# Patient Record
Sex: Female | Born: 1980 | Race: Black or African American | Hispanic: No | Marital: Single | Smoking: Never smoker
Health system: Southern US, Community
[De-identification: ages and names within clinical notes are randomized; demographics above are authoritative.]

## PROBLEM LIST (undated history)

## (undated) HISTORY — PX: OTHER SURGICAL HISTORY: SHX169

## (undated) HISTORY — PX: MOLE REMOVAL: SHX2046

---

## 2005-03-29 ENCOUNTER — Emergency Department: Payer: Self-pay | Admitting: General Practice

## 2005-04-24 ENCOUNTER — Emergency Department: Payer: Self-pay | Admitting: Internal Medicine

## 2005-07-25 ENCOUNTER — Observation Stay: Payer: Self-pay | Admitting: Obstetrics and Gynecology

## 2005-10-12 ENCOUNTER — Observation Stay: Payer: Self-pay | Admitting: Obstetrics and Gynecology

## 2006-01-18 ENCOUNTER — Emergency Department: Payer: Self-pay | Admitting: Emergency Medicine

## 2007-11-25 ENCOUNTER — Emergency Department: Payer: Self-pay | Admitting: Emergency Medicine

## 2010-01-24 ENCOUNTER — Emergency Department (HOSPITAL_COMMUNITY): Admission: EM | Admit: 2010-01-24 | Discharge: 2010-01-24 | Payer: Self-pay | Admitting: Emergency Medicine

## 2010-03-25 ENCOUNTER — Inpatient Hospital Stay (HOSPITAL_COMMUNITY): Admission: AD | Admit: 2010-03-25 | Discharge: 2010-03-25 | Payer: Self-pay | Admitting: Obstetrics & Gynecology

## 2011-02-11 LAB — HERPES SIMPLEX VIRUS CULTURE

## 2011-02-11 LAB — DIFFERENTIAL
Basophils Absolute: 0 10*3/uL (ref 0.0–0.1)
Basophils Relative: 0 % (ref 0–1)
Eosinophils Absolute: 0.1 10*3/uL (ref 0.0–0.7)
Lymphs Abs: 1 10*3/uL (ref 0.7–4.0)
Neutro Abs: 3.9 10*3/uL (ref 1.7–7.7)

## 2011-02-11 LAB — CBC
Hemoglobin: 10.1 g/dL — ABNORMAL LOW (ref 12.0–15.0)
MCHC: 35.2 g/dL (ref 30.0–36.0)
WBC: 5.4 10*3/uL (ref 4.0–10.5)

## 2011-02-11 LAB — WOUND CULTURE
Culture: NO GROWTH
Gram Stain: NONE SEEN

## 2011-02-17 LAB — DIFFERENTIAL
Basophils Relative: 0 % (ref 0–1)
Eosinophils Absolute: 0.1 10*3/uL (ref 0.0–0.7)
Eosinophils Relative: 1 % (ref 0–5)
Lymphocytes Relative: 28 % (ref 12–46)
Lymphs Abs: 2.2 10*3/uL (ref 0.7–4.0)
Monocytes Absolute: 0.5 10*3/uL (ref 0.1–1.0)
Neutro Abs: 5.2 10*3/uL (ref 1.7–7.7)

## 2011-02-17 LAB — URINE MICROSCOPIC-ADD ON

## 2011-02-17 LAB — CBC
HCT: 35.6 % — ABNORMAL LOW (ref 36.0–46.0)
MCHC: 33.8 g/dL (ref 30.0–36.0)
MCV: 83.1 fL (ref 78.0–100.0)
RDW: 13.5 % (ref 11.5–15.5)

## 2011-02-17 LAB — WET PREP, GENITAL
Trich, Wet Prep: NONE SEEN
Yeast Wet Prep HPF POC: NONE SEEN

## 2011-02-17 LAB — URINALYSIS, ROUTINE W REFLEX MICROSCOPIC
Hgb urine dipstick: NEGATIVE
Ketones, ur: NEGATIVE mg/dL

## 2011-02-17 LAB — PREGNANCY, URINE: Preg Test, Ur: POSITIVE

## 2011-02-17 LAB — GC/CHLAMYDIA PROBE AMP, GENITAL
Chlamydia, DNA Probe: NEGATIVE
GC Probe Amp, Genital: NEGATIVE

## 2011-05-11 ENCOUNTER — Emergency Department: Payer: Self-pay | Admitting: Emergency Medicine

## 2011-11-18 ENCOUNTER — Emergency Department: Payer: Self-pay | Admitting: *Deleted

## 2015-01-09 ENCOUNTER — Emergency Department: Payer: Self-pay | Admitting: Emergency Medicine

## 2015-04-22 ENCOUNTER — Encounter: Payer: Self-pay | Admitting: Emergency Medicine

## 2015-04-22 DIAGNOSIS — Z3202 Encounter for pregnancy test, result negative: Secondary | ICD-10-CM | POA: Diagnosis not present

## 2015-04-22 DIAGNOSIS — N8320 Unspecified ovarian cysts: Secondary | ICD-10-CM | POA: Insufficient documentation

## 2015-04-22 DIAGNOSIS — N760 Acute vaginitis: Secondary | ICD-10-CM | POA: Diagnosis not present

## 2015-04-22 DIAGNOSIS — R109 Unspecified abdominal pain: Secondary | ICD-10-CM | POA: Diagnosis present

## 2015-04-22 LAB — COMPREHENSIVE METABOLIC PANEL
ALBUMIN: 4 g/dL (ref 3.5–5.0)
ALT: 12 U/L — ABNORMAL LOW (ref 14–54)
AST: 16 U/L (ref 15–41)
Alkaline Phosphatase: 68 U/L (ref 38–126)
Anion gap: 10 (ref 5–15)
BUN: 11 mg/dL (ref 6–20)
CALCIUM: 9.3 mg/dL (ref 8.9–10.3)
CO2: 27 mmol/L (ref 22–32)
CREATININE: 0.86 mg/dL (ref 0.44–1.00)
Chloride: 102 mmol/L (ref 101–111)
GFR calc non Af Amer: >60 mL/min (ref >60)
Glucose, Bld: 99 mg/dL (ref 65–99)
Potassium: 4.2 mmol/L (ref 3.5–5.1)
Sodium: 139 mmol/L (ref 135–145)
Total Bilirubin: 0.2 mg/dL — ABNORMAL LOW (ref 0.3–1.2)
Total Protein: 8.3 g/dL — ABNORMAL HIGH (ref 6.5–8.1)

## 2015-04-22 LAB — CBC WITH DIFFERENTIAL/PLATELET
BASOS PCT: 1 %
Basophils Absolute: 0.1 10*3/uL (ref 0–0.1)
EOS ABS: 0.1 10*3/uL (ref 0–0.7)
EOS PCT: 2 %
HEMATOCRIT: 34.4 % — AB (ref 35.0–47.0)
Hemoglobin: 11.3 g/dL — ABNORMAL LOW (ref 12.0–16.0)
LYMPHS ABS: 2.3 10*3/uL (ref 1.0–3.6)
Lymphocytes Relative: 30 %
MCH: 25.8 pg — AB (ref 26.0–34.0)
MCHC: 33 g/dL (ref 32.0–36.0)
MCV: 78.3 fL — AB (ref 80.0–100.0)
MONO ABS: 0.4 10*3/uL (ref 0.2–0.9)
Monocytes Relative: 6 %
Neutro Abs: 4.9 10*3/uL (ref 1.4–6.5)
Neutrophils Relative %: 63 %
Platelets: 356 10*3/uL (ref 150–440)
RBC: 4.39 MIL/uL (ref 3.80–5.20)
RDW: 14.5 % (ref 11.5–14.5)
WBC: 7.9 10*3/uL (ref 3.6–11.0)

## 2015-04-22 LAB — URINALYSIS COMPLETE WITH MICROSCOPIC (ARMC ONLY)
Bilirubin Urine: NEGATIVE
GLUCOSE, UA: NEGATIVE mg/dL
KETONES UR: NEGATIVE mg/dL
Nitrite: NEGATIVE
PROTEIN: NEGATIVE mg/dL
SPECIFIC GRAVITY, URINE: 1.033 — AB (ref 1.005–1.030)
pH: 5 (ref 5.0–8.0)

## 2015-04-22 LAB — POCT PREGNANCY, URINE: PREG TEST UR: NEGATIVE

## 2015-04-22 NOTE — ED Notes (Signed)
Pt states has had llq pain radiating to pelvis since yesterday am. Pt denies fever, chills, diarrhea, vomiting, nausea. Pt states "i have felt a little lightheaded." pt denies dysuria, hematuria. Pt describes pain as intermittent cramp like "spasms". Pt with normal color, warm and dry skin.

## 2015-04-23 ENCOUNTER — Emergency Department
Admission: EM | Admit: 2015-04-23 | Discharge: 2015-04-23 | Disposition: A | Discharge status: Home / Self Care | Payer: Medicaid Other | Attending: Emergency Medicine | Admitting: Emergency Medicine

## 2015-04-23 ENCOUNTER — Emergency Department: Payer: Medicaid Other

## 2015-04-23 DIAGNOSIS — R102 Pelvic and perineal pain: Secondary | ICD-10-CM

## 2015-04-23 DIAGNOSIS — R1032 Left lower quadrant pain: Secondary | ICD-10-CM

## 2015-04-23 DIAGNOSIS — N83202 Unspecified ovarian cyst, left side: Secondary | ICD-10-CM

## 2015-04-23 DIAGNOSIS — N760 Acute vaginitis: Secondary | ICD-10-CM

## 2015-04-23 DIAGNOSIS — B9689 Other specified bacterial agents as the cause of diseases classified elsewhere: Secondary | ICD-10-CM

## 2015-04-23 DIAGNOSIS — R52 Pain, unspecified: Secondary | ICD-10-CM

## 2015-04-23 LAB — CHLAMYDIA/NGC RT PCR (ARMC ONLY)
CHLAMYDIA TR: NOT DETECTED
N GONORRHOEAE: NOT DETECTED

## 2015-04-23 LAB — WET PREP, GENITAL
Trich, Wet Prep: NONE SEEN
YEAST WET PREP: NONE SEEN

## 2015-04-23 MED ORDER — ONDANSETRON 4 MG PO TBDP
4.0000 mg | ORAL_TABLET | Freq: Once | ORAL | Status: AC
  Administered 2015-04-23: 4 mg via ORAL

## 2015-04-23 MED ORDER — ONDANSETRON 4 MG PO TBDP
ORAL_TABLET | ORAL | Status: AC
  Administered 2015-04-23: 4 mg via ORAL
  Filled 2015-04-23: qty 1

## 2015-04-23 MED ORDER — MORPHINE SULFATE 4 MG/ML IJ SOLN
4.0000 mg | Freq: Once | INTRAMUSCULAR | Status: AC
  Administered 2015-04-23: 4 mg via INTRAMUSCULAR

## 2015-04-23 MED ORDER — ONDANSETRON HCL 4 MG PO TABS: 4.0000 mg | ORAL_TABLET | Freq: Three times a day (TID) | ORAL | Status: DC | PRN

## 2015-04-23 MED ORDER — METRONIDAZOLE 500 MG PO TABS: 500.0000 mg | ORAL_TABLET | Freq: Two times a day (BID) | ORAL | Status: DC

## 2015-04-23 MED ORDER — OXYCODONE-ACETAMINOPHEN 5-325 MG PO TABS
ORAL_TABLET | ORAL | Status: AC
  Administered 2015-04-23: 1 via ORAL
  Filled 2015-04-23: qty 1

## 2015-04-23 MED ORDER — OXYCODONE-ACETAMINOPHEN 5-325 MG PO TABS
1.0000 | ORAL_TABLET | Freq: Once | ORAL | Status: AC
  Administered 2015-04-23: 1 via ORAL

## 2015-04-23 MED ORDER — MORPHINE SULFATE 4 MG/ML IJ SOLN
INTRAMUSCULAR | Status: AC
  Administered 2015-04-23: 4 mg via INTRAMUSCULAR
  Filled 2015-04-23: qty 1

## 2015-04-23 MED ORDER — OXYCODONE-ACETAMINOPHEN 5-325 MG PO TABS: 1.0000 | ORAL_TABLET | ORAL | Status: DC | PRN

## 2015-04-23 NOTE — Discharge Instructions (Signed)
1. Take antibiotic as prescribed (Flagyl 500 mg twice daily 7 days). 2. Take pain medicines as needed (Motrin/Percocet #15). 3. Take nausea medicine as needed (Zofran #15). 4. Return to the ER for worsening symptoms, persistent vomiting, difficulty breathing or other concerns.  Pelvic Pain Female pelvic pain can be caused by many different things and start from a variety of places. Pelvic pain refers to pain that is located in the lower half of the abdomen and between your hips. The pain may occur over a short period of time (acute) or may be reoccurring (chronic). The cause of pelvic pain may be related to disorders affecting the female reproductive organs (gynecologic), but it may also be related to the bladder, kidney stones, an intestinal complication, or muscle or skeletal problems. Getting help right away for pelvic pain is important, especially if there has been severe, sharp, or a sudden onset of unusual pain. It is also important to get help right away because some types of pelvic pain can be life threatening.  CAUSES  Below are only some of the causes of pelvic pain. The causes of pelvic pain can be in one of several categories.   Gynecologic.  Pelvic inflammatory disease.  Sexually transmitted infection.  Ovarian cyst or a twisted ovarian ligament (ovarian torsion).  Uterine lining that grows outside the uterus (endometriosis).  Fibroids, cysts, or tumors.  Ovulation.  Pregnancy.  Pregnancy that occurs outside the uterus (ectopic pregnancy).  Miscarriage.  Labor.  Abruption of the placenta or ruptured uterus.  Infection.  Uterine infection (endometritis).  Bladder infection.  Diverticulitis.  Miscarriage related to a uterine infection (septic abortion).  Bladder.  Inflammation of the bladder (cystitis).  Kidney stone(s).  Gastrointestinal.  Constipation.  Diverticulitis.  Neurologic.  Trauma.  Feeling pelvic pain because of mental or emotional  causes (psychosomatic).  Cancers of the bowel or pelvis. EVALUATION  Your caregiver will want to take a careful history of your concerns. This includes recent changes in your health, a careful gynecologic history of your periods (menses), and a sexual history. Obtaining your family history and medical history is also important. Your caregiver may suggest a pelvic exam. A pelvic exam will help identify the location and severity of the pain. It also helps in the evaluation of which organ system may be involved. In order to identify the cause of the pelvic pain and be properly treated, your caregiver may order tests. These tests may include:   A pregnancy test.  Pelvic ultrasonography.  An X-ray exam of the abdomen.  A urinalysis or evaluation of vaginal discharge.  Blood tests. HOME CARE INSTRUCTIONS   Only take over-the-counter or prescription medicines for pain, discomfort, or fever as directed by your caregiver.   Rest as directed by your caregiver.   Eat a balanced diet.   Drink enough fluids to make your urine clear or pale yellow, or as directed.   Avoid sexual intercourse if it causes pain.   Apply warm or cold compresses to the lower abdomen depending on which one helps the pain.   Avoid stressful situations.   Keep a journal of your pelvic pain. Write down when it started, where the pain is located, and if there are things that seem to be associated with the pain, such as food or your menstrual cycle.  Follow up with your caregiver as directed.  SEEK MEDICAL CARE IF:  Your medicine does not help your pain.  You have abnormal vaginal discharge. SEEK IMMEDIATE MEDICAL CARE IF:  You have heavy bleeding from the vagina.   Your pelvic pain increases.   You feel light-headed or faint.   You have chills.   You have pain with urination or blood in your urine.   You have uncontrolled diarrhea or vomiting.   You have a fever or persistent symptoms for  more than 3 days.  You have a fever and your symptoms suddenly get worse.   You are being physically or sexually abused.  MAKE SURE YOU:  Understand these instructions.  Will watch your condition.  Will get help if you are not doing well or get worse. Document Released: 10/07/2004 Document Revised: 03/27/2014 Document Reviewed: 03/01/2012 [facility] Patient Information 2015 [place], [place]. This information is not intended to replace advice given to you by your health care provider. Make sure you discuss any questions you have with your health care provider.  Ovarian Cyst An ovarian cyst is a fluid-filled sac that forms on an ovary. The ovaries are small organs that produce eggs in women. Various types of cysts can form on the ovaries. Most are not cancerous. Many do not cause problems, and they often go away on their own. Some may cause symptoms and require treatment. Common types of ovarian cysts include:  Functional cysts--These cysts may occur every month during the menstrual cycle. This is normal. The cysts usually go away with the next menstrual cycle if the woman does not get pregnant. Usually, there are no symptoms with a functional cyst.  Endometrioma cysts--These cysts form from the tissue that lines the uterus. They are also called "chocolate cysts" because they become filled with blood that turns brown. This type of cyst can cause pain in the lower abdomen during intercourse and with your menstrual period.  Cystadenoma cysts--This type develops from the cells on the outside of the ovary. These cysts can get very big and cause lower abdomen pain and pain with intercourse. This type of cyst can twist on itself, cut off its blood supply, and cause severe pain. It can also easily rupture and cause a lot of pain.  Dermoid cysts--This type of cyst is sometimes found in both ovaries. These cysts may contain different kinds of body tissue, such as skin, teeth, hair, or cartilage. They  usually do not cause symptoms unless they get very big.  Theca lutein cysts--These cysts occur when too much of a certain hormone (human chorionic gonadotropin) is produced and overstimulates the ovaries to produce an egg. This is most common after procedures used to assist with the conception of a baby (in vitro fertilization). CAUSES   Fertility drugs can cause a condition in which multiple large cysts are formed on the ovaries. This is called ovarian hyperstimulation syndrome.  A condition called polycystic ovary syndrome can cause hormonal imbalances that can lead to nonfunctional ovarian cysts. SIGNS AND SYMPTOMS  Many ovarian cysts do not cause symptoms. If symptoms are present, they may include:  Pelvic pain or pressure.  Pain in the lower abdomen.  Pain during sexual intercourse.  Increasing girth (swelling) of the abdomen.  Abnormal menstrual periods.  Increasing pain with menstrual periods.  Stopping having menstrual periods without being pregnant. DIAGNOSIS  These cysts are commonly found during a routine or annual pelvic exam. Tests may be ordered to find out more about the cyst. These tests may include:  Ultrasound.  X-ray of the pelvis.  CT scan.  MRI.  Blood tests. TREATMENT  Many ovarian cysts go away on their own without treatment. Your  health care provider may want to check your cyst regularly for 2-3 months to see if it changes. For women in menopause, it is particularly important to monitor a cyst closely because of the higher rate of ovarian cancer in menopausal women. When treatment is needed, it may include any of the following:  A procedure to drain the cyst (aspiration). This may be done using a long needle and ultrasound. It can also be done through a laparoscopic procedure. This involves using a thin, lighted tube with a tiny camera on the end (laparoscope) inserted through a small incision.  Surgery to remove the whole cyst. This may be done using  laparoscopic surgery or an open surgery involving a larger incision in the lower abdomen.  Hormone treatment or birth control pills. These methods are sometimes used to help dissolve a cyst. HOME CARE INSTRUCTIONS   Only take over-the-counter or prescription medicines as directed by your health care provider.  Follow up with your health care provider as directed.  Get regular pelvic exams and Pap tests. SEEK MEDICAL CARE IF:   Your periods are late, irregular, or painful, or they stop.  Your pelvic pain or abdominal pain does not go away.  Your abdomen becomes larger or swollen.  You have pressure on your bladder or trouble emptying your bladder completely.  You have pain during sexual intercourse.  You have feelings of fullness, pressure, or discomfort in your stomach.  You lose weight for no apparent reason.  You feel generally ill.  You become constipated.  You lose your appetite.  You develop acne.  You have an increase in body and facial hair.  You are gaining weight, without changing your exercise and eating habits.  You think you are pregnant. SEEK IMMEDIATE MEDICAL CARE IF:   You have increasing abdominal pain.  You feel sick to your stomach (nauseous), and you throw up (vomit).  You develop a fever that comes on suddenly.  You have abdominal pain during a bowel movement.  Your menstrual periods become heavier than usual. MAKE SURE YOU:  Understand these instructions.  Will watch your condition.  Will get help right away if you are not doing well or get worse. Document Released: 11/10/2005 Document Revised: 11/15/2013 Document Reviewed: 07/18/2013 [facility] Patient Information 2015 [place], [place]. This information is not intended to replace advice given to you by your health care provider. Make sure you discuss any questions you have with your health care provider.  Bacterial Vaginosis Bacterial vaginosis is a vaginal infection that occurs when the  normal balance of bacteria in the vagina is disrupted. It results from an overgrowth of certain bacteria. This is the most common vaginal infection in women of childbearing age. Treatment is important to prevent complications, especially in pregnant women, as it can cause a premature delivery. CAUSES  Bacterial vaginosis is caused by an increase in harmful bacteria that are normally present in smaller amounts in the vagina. Several different kinds of bacteria can cause bacterial vaginosis. However, the reason that the condition develops is not fully understood. RISK FACTORS Certain activities or behaviors can put you at an increased risk of developing bacterial vaginosis, including:  Having a new sex partner or multiple sex partners.  Douching.  Using an intrauterine device (IUD) for contraception. Women do not get bacterial vaginosis from toilet seats, bedding, swimming pools, or contact with objects around them. SIGNS AND SYMPTOMS  Some women with bacterial vaginosis have no signs or symptoms. Common symptoms include:  Grey vaginal  discharge.  A fishlike odor with discharge, especially after sexual intercourse.  Itching or burning of the vagina and vulva.  Burning or pain with urination. DIAGNOSIS  Your health care provider will take a medical history and examine the vagina for signs of bacterial vaginosis. A sample of vaginal fluid may be taken. Your health care provider will look at this sample under a microscope to check for bacteria and abnormal cells. A vaginal pH test may also be done.  TREATMENT  Bacterial vaginosis may be treated with antibiotic medicines. These may be given in the form of a pill or a vaginal cream. A second round of antibiotics may be prescribed if the condition comes back after treatment.  HOME CARE INSTRUCTIONS   Only take over-the-counter or prescription medicines as directed by your health care provider.  If antibiotic medicine was prescribed, take it as  directed. Make sure you finish it even if you start to feel better.  Do not have sex until treatment is completed.  Tell all sexual partners that you have a vaginal infection. They should see their health care provider and be treated if they have problems, such as a mild rash or itching.  Practice safe sex by using condoms and only having one sex partner. SEEK MEDICAL CARE IF:   Your symptoms are not improving after 3 days of treatment.  You have increased discharge or pain.  You have a fever. MAKE SURE YOU:   Understand these instructions.  Will watch your condition.  Will get help right away if you are not doing well or get worse. FOR MORE INFORMATION  Centers for Disease Control and Prevention, Division of STD Prevention: [employer] American Sexual Health Association (ASHA): www.ashastd.org  Document Released: 11/10/2005 Document Revised: 08/31/2013 Document Reviewed: 06/22/2013 [facility] Patient Information 2015 [place], [place]. This information is not intended to replace advice given to you by your health care provider. Make sure you discuss any questions you have with your health care provider.

## 2015-04-23 NOTE — ED Notes (Signed)
Patient transported to Ultrasound 

## 2015-04-23 NOTE — ED Provider Notes (Signed)
[facility] Emergency Department Provider Note  ____________________________________________  Time seen: Approximately 12:46 AM  I have reviewed the triage vital signs and the nursing notes.   HISTORY  Chief Complaint Abdominal Pain    HPI Taylor Stephens is a 34 y.o. female who presents with a 2 day history of left lower quadrant pelvic pain. States she awoke with pain yesterday and has had waxing/waning pain since. Patient describes 7/10 sharp left pelvic pain radiating to suprapubic region. States she has been "a little lightheaded". Patient denies fever, chills, chest pain, shortness of breath, nausea, vomiting, diarrhea, headache, weakness, numbness, tingling. Nothing makes the pain better or worse. Patient has never had pain like this previously.Patient denies vaginal discharge or bleeding; last sexual intercourse over 1 week ago. Patient denies concern for STDs.   History reviewed. No pertinent past medical history. No history of ovarian cysts or endometriosis.  There are no active problems to display for this patient.   Past Surgical History  Procedure Laterality Date  . Tubal ligastion     Medications None  Allergies Ibuprofen  Past Family History Ovarian cysts  Social History History  Substance Use Topics  . Smoking status: Never Smoker   . Smokeless tobacco: Never Used  . Alcohol Use: Yes    Review of Systems Constitutional: No fever/chills Eyes: No visual changes. ENT: No sore throat. Cardiovascular: Denies chest pain. Respiratory: Denies shortness of breath. Gastrointestinal: Positive for abdominal pain.  No nausea, no vomiting.  No diarrhea.  No constipation. Genitourinary: Negative for dysuria. Musculoskeletal: Negative for back pain. Skin: Negative for rash. Neurological: Negative for headaches, focal weakness or numbness.  10-point ROS otherwise negative.  ____________________________________________   PHYSICAL  EXAM:  VITAL SIGNS: ED Triage Vitals  Enc Vitals Group     BP 04/22/15 2035 137/76 mmHg     Pulse Rate 04/22/15 2035 85     Resp 04/22/15 2035 16     Temp 04/22/15 2035 98.2 F (36.8 C)     Temp Source 04/22/15 2035 Oral     SpO2 04/22/15 2035 100 %     Weight 04/22/15 2035 180 lb (81.647 kg)     Height 04/22/15 2035 5\' 3"  (1.6 m)     Head Cir --      Peak Flow --      Pain Score 04/22/15 2036 8     Pain Loc --      Pain Edu? --      Excl. in GC? --     Constitutional: Alert and oriented. Well appearing and in mild acute distress. Eyes: Conjunctivae are normal. PERRL. EOMI. Head: Atraumatic. Nose: No congestion/rhinnorhea. Mouth/Throat: Mucous membranes are moist.  Oropharynx non-erythematous. Neck: No stridor.   Cardiovascular: Normal rate, regular rhythm. Grossly normal heart sounds.  Good peripheral circulation. Respiratory: Normal respiratory effort.  No retractions. Lungs CTAB. Gastrointestinal: Soft, tender to palpation left lower quadrant without rebound or guarding.. No distention. No abdominal bruits. No CVA tenderness. Pelvic: External exam normal without vesicles. Speculum exam with mild white discharge. No vaginal bleeding. No cervical dilation. Left adnexa tender on bimanual exam. Musculoskeletal: No lower extremity tenderness nor edema.  No joint effusions. Neurologic:  Normal speech and language. No gross focal neurologic deficits are appreciated. Speech is normal. No gait instability. Skin:  Skin is warm, dry and intact. No rash noted. Psychiatric: Mood and affect are normal. Speech and behavior are normal.  ____________________________________________   LABS (all labs ordered are listed, but only  abnormal results are displayed)  Labs Reviewed  WET PREP, GENITAL - Abnormal; Notable for the following:    Clue Cells Wet Prep HPF POC MODERATE (*)    WBC, Wet Prep HPF POC MANY (*)    All other components within normal limits  CBC WITH DIFFERENTIAL/PLATELET -  Abnormal; Notable for the following:    Hemoglobin 11.3 (*)    HCT 34.4 (*)    MCV 78.3 (*)    MCH 25.8 (*)    All other components within normal limits  COMPREHENSIVE METABOLIC PANEL - Abnormal; Notable for the following:    Total Protein 8.3 (*)    ALT 12 (*)    Total Bilirubin 0.2 (*)    All other components within normal limits  URINALYSIS COMPLETEWITH MICROSCOPIC ([facility] ONLY) - Abnormal; Notable for the following:    Color, Urine YELLOW (*)    APPearance HAZY (*)    Specific Gravity, Urine 1.033 (*)    Hgb urine dipstick 1+ (*)    Leukocytes, UA 1+ (*)    Bacteria, UA RARE (*)    Squamous Epithelial / LPF 6-30 (*)    All other components within normal limits  CHLAMYDIA/NGC RT PCR ([facility] ONLY)  POC URINE PREG, ED  POCT PREGNANCY, URINE   ____________________________________________  EKG  None ____________________________________________  RADIOLOGY  Pelvis ultrasound per Dr. Cherly Hensen: 1. 3.5 cm hemorrhagic cyst at the left ovary. 2. Otherwise unremarkable pelvic ultrasound. No evidence for ovarian torsion. __________________________________________   PROCEDURES  Procedure(s) performed: None  Critical Care performed: No  ____________________________________________   INITIAL IMPRESSION / ASSESSMENT AND PLAN / ED COURSE  Pertinent labs & imaging results that were available during my care of the patient were reviewed by me and considered in my medical decision making (see chart for details).  34 year old female presenting with left adnexal pain. Will obtain wet prep and GC/Chlamydia specimens. Plan for IM analgesia and pelvic ultrasound.  ----------------------------------------- 4:01 AM on 04/23/2015 -----------------------------------------  Patient improved. Discussed with patient results of wet prep and ultrasound. Plan for analgesia, Flagyl and gynecological follow-up. Strict return precautions given. Patient verbalizes understanding and agrees  with plan of care. ____________________________________________   FINAL CLINICAL IMPRESSION(S) / ED DIAGNOSES  Final diagnoses:  Pain  Pelvic pain in female  Cyst of left ovary  Bacterial vaginosis      Irean Hong, MD 04/23/15 [phone]

## 2015-07-11 ENCOUNTER — Emergency Department
Admission: EM | Admit: 2015-07-11 | Discharge: 2015-07-11 | Disposition: A | Discharge status: Home / Self Care | Payer: Medicaid Other | Attending: Emergency Medicine | Admitting: Emergency Medicine

## 2015-07-11 DIAGNOSIS — J069 Acute upper respiratory infection, unspecified: Secondary | ICD-10-CM | POA: Diagnosis not present

## 2015-07-11 DIAGNOSIS — J029 Acute pharyngitis, unspecified: Secondary | ICD-10-CM | POA: Diagnosis present

## 2015-07-11 DIAGNOSIS — Z79899 Other long term (current) drug therapy: Secondary | ICD-10-CM | POA: Insufficient documentation

## 2015-07-11 LAB — POCT RAPID STREP A: Streptococcus, Group A Screen (Direct): NEGATIVE

## 2015-07-11 MED ORDER — ACETAMINOPHEN 325 MG PO TABS: 650.0000 mg | ORAL_TABLET | Freq: Once | ORAL | Status: AC

## 2015-07-11 MED ORDER — GUAIFENESIN-CODEINE 100-10 MG/5ML PO SOLN: 10.0000 mL | ORAL | Status: DC | PRN

## 2015-07-11 MED ORDER — CHLORPHENIRAMINE MALEATE 4 MG PO TABS: 4.0000 mg | ORAL_TABLET | Freq: Two times a day (BID) | ORAL | Status: DC | PRN

## 2015-07-11 MED ORDER — AZITHROMYCIN 250 MG PO TABS: ORAL_TABLET | ORAL | Status: DC

## 2015-07-11 MED ORDER — ACETAMINOPHEN 325 MG PO TABS
ORAL_TABLET | ORAL | Status: AC
  Administered 2015-07-11: 650 mg
  Filled 2015-07-11: qty 2

## 2015-07-11 NOTE — ED Notes (Signed)
Pt in with sore throat for 3 days, no has bodyaches, cough, and nasal congestion.

## 2015-07-11 NOTE — ED Notes (Signed)
Patient presents with a nonproductive cough, body aches, nausea without vomiting. States she has been feeling poorly x 3 days. Unknown if she has had a fever at home. States she does not feel like she has had one.

## 2015-07-11 NOTE — ED Notes (Signed)
Pt has a sore throat and cough for 4 days.  Nonsmoker.  No earache.

## 2015-07-11 NOTE — Discharge Instructions (Signed)
Upper Respiratory Infection, Adult An upper respiratory infection (URI) is also sometimes known as the common cold. The upper respiratory tract includes the nose, sinuses, throat, trachea, and bronchi. Bronchi are the airways leading to the lungs. Most people improve within 1 week, but symptoms can last up to 2 weeks. A residual cough may last even longer.  CAUSES Many different viruses can infect the tissues lining the upper respiratory tract. The tissues become irritated and inflamed and often become very moist. Mucus production is also common. A cold is contagious. You can easily spread the virus to others by oral contact. This includes kissing, sharing a glass, coughing, or sneezing. Touching your mouth or nose and then touching a surface, which is then touched by another person, can also spread the virus. SYMPTOMS  Symptoms typically develop 1 to 3 days after you come in contact with a cold virus. Symptoms vary from person to person. They may include:  Runny nose.  Sneezing.  Nasal congestion.  Sinus irritation.  Sore throat.  Loss of voice (laryngitis).  Cough.  Fatigue.  Muscle aches.  Loss of appetite.  Headache.  Low-grade fever. DIAGNOSIS  You might diagnose your own cold based on familiar symptoms, since most people get a cold 2 to 3 times a year. Your caregiver can confirm this based on your exam. Most importantly, your caregiver can check that your symptoms are not due to another disease such as strep throat, sinusitis, pneumonia, asthma, or epiglottitis. Blood tests, throat tests, and X-rays are not necessary to diagnose a common cold, but they may sometimes be helpful in excluding other more serious diseases. Your caregiver will decide if any further tests are required. RISKS AND COMPLICATIONS  You may be at risk for a more severe case of the common cold if you smoke cigarettes, have chronic heart disease (such as heart failure) or lung disease (such as asthma), or if  you have a weakened immune system. The very young and very old are also at risk for more serious infections. Bacterial sinusitis, middle ear infections, and bacterial pneumonia can complicate the common cold. The common cold can worsen asthma and chronic obstructive pulmonary disease (COPD). Sometimes, these complications can require emergency medical care and may be life-threatening. PREVENTION  The best way to protect against getting a cold is to practice good hygiene. Avoid oral or hand contact with people with cold symptoms. Wash your hands often if contact occurs. There is no clear evidence that vitamin C, vitamin E, echinacea, or exercise reduces the chance of developing a cold. However, it is always recommended to get plenty of rest and practice good nutrition. TREATMENT  Treatment is directed at relieving symptoms. There is no cure. Antibiotics are not effective, because the infection is caused by a virus, not by bacteria. Treatment may include:  Increased fluid intake. Sports drinks offer valuable electrolytes, sugars, and fluids.  Breathing heated mist or steam (vaporizer or shower).  Eating chicken soup or other clear broths, and maintaining good nutrition.  Getting plenty of rest.  Using gargles or lozenges for comfort.  Controlling fevers with ibuprofen or acetaminophen as directed by your caregiver.  Increasing usage of your inhaler if you have asthma. Zinc gel and zinc lozenges, taken in the first 24 hours of the common cold, can shorten the duration and lessen the severity of symptoms. Pain medicines may help with fever, muscle aches, and throat pain. A variety of non-prescription medicines are available to treat congestion and runny nose. Your caregiver   can make recommendations and may suggest nasal or lung inhalers for other symptoms.  HOME CARE INSTRUCTIONS   Only take over-the-counter or prescription medicines for pain, discomfort, or fever as directed by your  caregiver.  Use a warm mist humidifier or inhale steam from a shower to increase air moisture. This may keep secretions moist and make it easier to breathe.  Drink enough water and fluids to keep your urine clear or pale yellow.  Rest as needed.  Return to work when your temperature has returned to normal or as your caregiver advises. You may need to stay home longer to avoid infecting others. You can also use a face mask and careful hand washing to prevent spread of the virus. SEEK MEDICAL CARE IF:   After the first few days, you feel you are getting worse rather than better.  You need your caregiver's advice about medicines to control symptoms.  You develop chills, worsening shortness of breath, or brown or red sputum. These may be signs of pneumonia.  You develop yellow or brown nasal discharge or pain in the face, especially when you bend forward. These may be signs of sinusitis.  You develop a fever, swollen neck glands, pain with swallowing, or white areas in the back of your throat. These may be signs of strep throat. SEEK IMMEDIATE MEDICAL CARE IF:   You have a fever.  You develop severe or persistent headache, ear pain, sinus pain, or chest pain.  You develop wheezing, a prolonged cough, cough up blood, or have a change in your usual mucus (if you have chronic lung disease).  You develop sore muscles or a stiff neck. Document Released: 05/06/2001 Document Revised: 02/02/2012 Document Reviewed: 02/15/2014 Exit[employer] Patient Information 2015 [employer]. This information is not intended to replace advice given to you by your health care provider. Make sure you discuss any questions you have with your health care provider.  

## 2015-07-11 NOTE — ED Provider Notes (Signed)
[facility] Emergency Department Provider Note  ____________________________________________  Time seen: Approximately 8:31 PM  I have reviewed the triage vital signs and the nursing notes.   HISTORY  Chief Complaint Sore Throat    HPI Taylor RODNEISHA Stephens is a 34 y.o. female presents for evaluation of sore throat cough runny nose congestion drainage 4 days.   No past medical history on file.  There are no active problems to display for this patient.   Past Surgical History  Procedure Laterality Date  . Tubal ligastion      Current Outpatient Rx  Name  Route  Sig  Dispense  Refill  . azithromycin (ZITHROMAX Z-PAK) 250 MG tablet      Take 2 tablets (500 mg) on  Day 1,  followed by 1 tablet (250 mg) once daily on Days 2 through 5.   6 each   0   . chlorpheniramine (CHLOR-TRIMETON) 4 MG tablet   Oral   Take 1 tablet (4 mg total) by mouth 2 (two) times daily as needed for allergies or rhinitis.   30 tablet   0   . guaiFENesin-codeine 100-10 MG/5ML syrup   Oral   Take 10 mLs by mouth every 4 (four) hours as needed for cough.   180 mL   0   . metroNIDAZOLE (FLAGYL) 500 MG tablet   Oral   Take 1 tablet (500 mg total) by mouth 2 (two) times daily.   14 tablet   0   . ondansetron (ZOFRAN) 4 MG tablet   Oral   Take 1 tablet (4 mg total) by mouth every 8 (eight) hours as needed for nausea or vomiting.   20 tablet   1   . oxyCODONE-acetaminophen (ROXICET) 5-325 MG per tablet   Oral   Take 1 tablet by mouth every 4 (four) hours as needed for severe pain.   20 tablet   0     Allergies Ibuprofen  No family history on file.  Social History Social History  Substance Use Topics  . Smoking status: Never Smoker   . Smokeless tobacco: Never Used  . Alcohol Use: Yes    Review of Systems Constitutional: No fever/chills Eyes: No visual changes. ENT: Positive sore throat or nasal drainage head congestion Cardiovascular: Denies chest  pain. Respiratory: Denies shortness of breath. Positive for cough Gastrointestinal: No abdominal pain.  No nausea, no vomiting.  No diarrhea.  No constipation. Genitourinary: Negative for dysuria. Musculoskeletal: Negative for back pain. Skin: Negative for rash. Neurological: Negative for headaches, focal weakness or numbness.  10-point ROS otherwise negative.  ____________________________________________   PHYSICAL EXAM:  VITAL SIGNS: ED Triage Vitals  Enc Vitals Group     BP 07/11/15 1927 118/79 mmHg     Pulse Rate 07/11/15 1927 117     Resp 07/11/15 1927 18     Temp 07/11/15 1927 99.8 F (37.7 C)     Temp Source 07/11/15 1927 Oral     SpO2 07/11/15 1927 97 %     Weight 07/11/15 1927 190 lb (86.183 kg)     Height 07/11/15 1927  (1.6 m)     Head Cir --      Peak Flow --      Pain Score 07/11/15 1927 8     Pain Loc --      Pain Edu? --      Excl. in GC? --     Constitutional: Alert and oriented. Well appearing and in no acute  distress. Eyes: Conjunctivae are normal. PERRL. EOMI. Head: Atraumatic. Nose: No congestion/rhinnorhea. Mouth/Throat: Mucous membranes are moist.  Oropharynx non-erythematous. Neck: No stridor.  Cardiovascular: Normal rate, regular rhythm. Grossly normal heart sounds.  Good peripheral circulation. Respiratory: Normal respiratory effort.  No retractions. Lungs CTAB. Gastrointestinal: Soft and nontender. No distention. No abdominal bruits. No CVA tenderness. Musculoskeletal: No lower extremity tenderness nor edema.  No joint effusions. Neurologic:  Normal speech and language. No gross focal neurologic deficits are appreciated. No gait instability. Skin:  Skin is warm, dry and intact. No rash noted. Psychiatric: Mood and affect are normal. Speech and behavior are normal.  ____________________________________________   LABS (all labs ordered are listed, but only abnormal results are displayed)  Labs Reviewed  POCT RAPID STREP A    ____________________________________________  PROCEDURES  Procedure(s) performed: None  Critical Care performed: No  ____________________________________________   INITIAL IMPRESSION / ASSESSMENT AND PLAN / ED COURSE  Pertinent labs & imaging results that were available during my care of the patient were reviewed by me and considered in my medical decision making (see chart for details).  Acute pharyngitis with upper respiratory infection. Rx given for Zithromax, Robitussin-AC and chlorpheniramine for drainage. Work excuse 2 days. Patient follow-up with PCP or return to the ER with any worsening symptomology.  At the time of discharge patient voices no other emergency medical complaints. ____________________________________________   FINAL CLINICAL IMPRESSION(S) / ED DIAGNOSES  Final diagnoses:  URI, acute      Evangeline Dakin, PA-C 07/11/15 2232  Phineas Semen, MD 07/12/15 [phone]

## 2015-10-26 ENCOUNTER — Emergency Department: Payer: Medicaid Other

## 2015-10-26 ENCOUNTER — Encounter: Payer: Self-pay | Admitting: Emergency Medicine

## 2015-10-26 ENCOUNTER — Emergency Department
Admission: EM | Admit: 2015-10-26 | Discharge: 2015-10-26 | Disposition: A | Discharge status: Home / Self Care | Payer: Medicaid Other | Attending: Emergency Medicine | Admitting: Emergency Medicine

## 2015-10-26 DIAGNOSIS — R109 Unspecified abdominal pain: Secondary | ICD-10-CM

## 2015-10-26 DIAGNOSIS — R102 Pelvic and perineal pain: Secondary | ICD-10-CM

## 2015-10-26 DIAGNOSIS — N739 Female pelvic inflammatory disease, unspecified: Secondary | ICD-10-CM | POA: Diagnosis not present

## 2015-10-26 DIAGNOSIS — Z3202 Encounter for pregnancy test, result negative: Secondary | ICD-10-CM | POA: Insufficient documentation

## 2015-10-26 LAB — CHLAMYDIA/NGC RT PCR (ARMC ONLY)
Chlamydia Tr: NOT DETECTED
N gonorrhoeae: NOT DETECTED

## 2015-10-26 LAB — URINALYSIS COMPLETE WITH MICROSCOPIC (ARMC ONLY)
BILIRUBIN URINE: NEGATIVE
Bacteria, UA: NONE SEEN
Glucose, UA: NEGATIVE mg/dL
Ketones, ur: NEGATIVE mg/dL
Leukocytes, UA: NEGATIVE
Nitrite: NEGATIVE
Protein, ur: NEGATIVE mg/dL
Specific Gravity, Urine: 1.021 (ref 1.005–1.030)
pH: 7 (ref 5.0–8.0)

## 2015-10-26 LAB — CBC WITH DIFFERENTIAL/PLATELET
Basophils Absolute: 0 10*3/uL (ref 0–0.1)
Basophils Relative: 1 %
Eosinophils Absolute: 0.1 10*3/uL (ref 0–0.7)
Eosinophils Relative: 2 %
HCT: 36.6 % (ref 35.0–47.0)
Hemoglobin: 11.8 g/dL — ABNORMAL LOW (ref 12.0–16.0)
LYMPHS ABS: 1.8 10*3/uL (ref 1.0–3.6)
Lymphocytes Relative: 31 %
MCH: 24.8 pg — AB (ref 26.0–34.0)
MCHC: 32.2 g/dL (ref 32.0–36.0)
MCV: 76.9 fL — AB (ref 80.0–100.0)
MONO ABS: 0.2 10*3/uL (ref 0.2–0.9)
MONOS PCT: 4 %
Neutro Abs: 3.6 10*3/uL (ref 1.4–6.5)
Neutrophils Relative %: 62 %
PLATELETS: 295 10*3/uL (ref 150–440)
RBC: 4.76 MIL/uL (ref 3.80–5.20)
RDW: 14.8 % — AB (ref 11.5–14.5)
WBC: 5.7 10*3/uL (ref 3.6–11.0)

## 2015-10-26 LAB — WET PREP, GENITAL
Clue Cells Wet Prep HPF POC: NONE SEEN
SPERM: NONE SEEN
TRICH WET PREP: NONE SEEN
YEAST WET PREP: NONE SEEN

## 2015-10-26 LAB — BASIC METABOLIC PANEL
Anion gap: 8 (ref 5–15)
BUN: 10 mg/dL (ref 6–20)
CALCIUM: 9.5 mg/dL (ref 8.9–10.3)
CO2: 25 mmol/L (ref 22–32)
CREATININE: 0.73 mg/dL (ref 0.44–1.00)
Chloride: 106 mmol/L (ref 101–111)
GFR calc Af Amer: >60 mL/min (ref >60)
GLUCOSE: 87 mg/dL (ref 65–99)
Potassium: 4.4 mmol/L (ref 3.5–5.1)
Sodium: 139 mmol/L (ref 135–145)

## 2015-10-26 LAB — POCT PREGNANCY, URINE: PREG TEST UR: NEGATIVE

## 2015-10-26 MED ORDER — MORPHINE SULFATE (PF) 4 MG/ML IV SOLN
INTRAVENOUS | Status: AC
  Administered 2015-10-26: 4 mg via INTRAVENOUS
  Filled 2015-10-26: qty 1

## 2015-10-26 MED ORDER — DOXYCYCLINE HYCLATE 100 MG PO TABS
100.0000 mg | ORAL_TABLET | Freq: Two times a day (BID) | ORAL | Status: DC
Start: 2015-10-26 — End: 2015-11-15

## 2015-10-26 MED ORDER — ONDANSETRON HCL 4 MG/2ML IJ SOLN
INTRAMUSCULAR | Status: AC
  Administered 2015-10-26: 4 mg
  Filled 2015-10-26: qty 2

## 2015-10-26 MED ORDER — MORPHINE SULFATE (PF) 4 MG/ML IV SOLN
4.0000 mg | Freq: Once | INTRAVENOUS | Status: AC
  Administered 2015-10-26: 4 mg via INTRAVENOUS

## 2015-10-26 MED ORDER — MORPHINE SULFATE (PF) 4 MG/ML IV SOLN
INTRAVENOUS | Status: AC
  Administered 2015-10-26: 4 mg
  Filled 2015-10-26: qty 1

## 2015-10-26 MED ORDER — TRAMADOL HCL 50 MG PO TABS: 50.0000 mg | ORAL_TABLET | Freq: Four times a day (QID) | ORAL | Status: DC | PRN

## 2015-10-26 MED ORDER — DOXYCYCLINE HYCLATE 100 MG PO TABS
100.0000 mg | ORAL_TABLET | Freq: Once | ORAL | Status: AC
  Administered 2015-10-26: 100 mg via ORAL
  Filled 2015-10-26: qty 1

## 2015-10-26 MED ORDER — CEFTRIAXONE SODIUM 250 MG IJ SOLR
250.0000 mg | Freq: Once | INTRAMUSCULAR | Status: AC
  Administered 2015-10-26: 250 mg via INTRAMUSCULAR
  Filled 2015-10-26: qty 250

## 2015-10-26 NOTE — ED Notes (Signed)
MD stabler at bedside.

## 2015-10-26 NOTE — ED Notes (Signed)
Developed severe  Sudden onset of left sided abd pain

## 2015-10-26 NOTE — Consult Note (Signed)
Obstetrics & Gynecology Consult H&P    Consulting Department: Emergency  Consulting Physician: Suezanne Jacquet MD  Consulting Question: Abdominal/Pelvic pain  History of Present Illness: Patient is a 34 y.o. [id number] presenting with acute onset LLQ pain this morning.  Pain sharp constant.  No associated symptoms such as constipation, diarrhea, vaginal discharge, fevers, chills.  Patient's last menstrual period was 10/21/2015.  Menses are regular monthly 5 days with first 3 days notable for moderate dysmenorrhea.   No dyspareunia.  The patient had a previous episode on 04/23/2015 at which time her symptoms were attributed to a 3.5cm left hemorrhagic ovarian cyst.  This has resolved on imaging today.     [facility]ative [facility] date 09/30/2010 reviewed showing normal pelvic anatomy at time of her tubal with application of one Filshie clip on each tube in the midisthmic portion.  Review of Systems:10 point review of systems  Past Medical History:  History reviewed. No pertinent past medical history.  Past Surgical History:  Past Surgical History  Procedure Laterality Date  . Tubal ligastion      Gynecologic History: Sees Sena Hitch, MD at [facility].  No history of abnormal paps  or STD's Obstetric History: G3P2012, TSVD x 2, EAB x 1  Family History:  No family history on file.  Social History:  Social History   Social History  . Marital Status: Single    Spouse Name: N/A  . Number of Children: N/A  . Years of Education: N/A   Occupational History  . Not on file.   Social History Main Topics  . Smoking status: Never Smoker   . Smokeless tobacco: Never Used  . Alcohol Use: Yes  . Drug Use: Not on file  . Sexual Activity: Yes   Other Topics Concern  . Not on file   Social History Narrative    Allergies:  Allergies  Allergen Reactions  . Ibuprofen Hives    Medications: Prior to Admission medications   Medication Sig Start Date End Date Taking?  Authorizing Provider  cetirizine (ZYRTEC) 10 MG tablet Take 10 mg by mouth daily as needed for allergies.   Yes Historical Provider, MD  chlorpheniramine (CHLOR-TRIMETON) 4 MG tablet Take 1 tablet (4 mg total) by mouth 2 (two) times daily as needed for allergies or rhinitis. Patient not taking: Reported on 10/26/2015 07/11/15   Pierce Crane Beers, PA-C  guaiFENesin-codeine 100-10 MG/5ML syrup Take 10 mLs by mouth every 4 (four) hours as needed for cough. Patient not taking: Reported on 10/26/2015 07/11/15   Arlyss Repress, PA-C    Physical Exam Vitals: Blood pressure 119/86, pulse 68, temperature 97.8 F (36.6 C), temperature source Oral, resp. rate 20, height 5' 3"  (1.6 m), weight 84.823 kg (187 lb), last menstrual period 10/21/2015, SpO2 98 %. General: NAD HEENT: normocephalic anicteric Pulmonary: CTAB Cardiovascular: RRR Abdomen: obese, soft, mild LLQ tenderness not as evident on distracted exam, no rebound, no guarding Genitourinary: deferred Extremities: no edema Neurologic: grossly intact Psychiatric: mood appropriate, affect full  Labs: Results for orders placed or performed during the hospital encounter of 10/26/15 (from the past 72 hour(s))  CBC with Differential     Status: Abnormal   Collection Time: 10/26/15 10:19 AM  Result Value Ref Range   WBC 5.7 3.6 - 11.0 K/uL   RBC 4.76 3.80 - 5.20 MIL/uL   Hemoglobin 11.8 (L) 12.0 - 16.0 g/dL   HCT 36.6 35.0 - 47.0 %   MCV 76.9 (L) 80.0 - 100.0 fL  MCH 24.8 (L) 26.0 - 34.0 pg   MCHC 32.2 32.0 - 36.0 g/dL   RDW 14.8 (H) 11.5 - 14.5 %   Platelets 295 150 - 440 K/uL   Neutrophils Relative % 62 %   Neutro Abs 3.6 1.4 - 6.5 K/uL   Lymphocytes Relative 31 %   Lymphs Abs 1.8 1.0 - 3.6 K/uL   Monocytes Relative 4 %   Monocytes Absolute 0.2 0.2 - 0.9 K/uL   Eosinophils Relative 2 %   Eosinophils Absolute 0.1 0 - 0.7 K/uL   Basophils Relative 1 %   Basophils Absolute 0.0 0 - 0.1 K/uL  Basic metabolic panel     Status: None    Collection Time: 10/26/15 10:19 AM  Result Value Ref Range   Sodium 139 135 - 145 mmol/L   Potassium 4.4 3.5 - 5.1 mmol/L    Comment: HEMOLYSIS AT THIS LEVEL MAY AFFECT RESULT   Chloride 106 101 - 111 mmol/L   CO2 25 22 - 32 mmol/L   Glucose, Bld 87 65 - 99 mg/dL   BUN 10 6 - 20 mg/dL   Creatinine, Ser 0.73 0.44 - 1.00 mg/dL   Calcium 9.5 8.9 - 10.3 mg/dL   GFR calc non Af Amer >60 >60 mL/min   GFR calc Af Amer >60 >60 mL/min    Comment: (NOTE) The eGFR has been calculated using the CKD EPI equation. This calculation has not been validated in all clinical situations. eGFR's persistently <60 mL/min signify possible Chronic Kidney Disease.    Anion gap 8 5 - 15  Urinalysis complete, with microscopic ([facility] only)     Status: Abnormal   Collection Time: 10/26/15 10:19 AM  Result Value Ref Range   Color, Urine YELLOW (A) YELLOW   APPearance CLEAR (A) CLEAR   Glucose, UA NEGATIVE NEGATIVE mg/dL   Bilirubin Urine NEGATIVE NEGATIVE   Ketones, ur NEGATIVE NEGATIVE mg/dL   Specific Gravity, Urine 1.021 1.005 - 1.030   Hgb urine dipstick 1+ (A) NEGATIVE   pH 7.0 5.0 - 8.0   Protein, ur NEGATIVE NEGATIVE mg/dL   Nitrite NEGATIVE NEGATIVE   Leukocytes, UA NEGATIVE NEGATIVE   RBC / HPF 0-5 0 - 5 RBC/hpf   WBC, UA 0-5 0 - 5 WBC/hpf   Bacteria, UA NONE SEEN NONE SEEN   Squamous Epithelial / LPF 0-5 (A) NONE SEEN   Mucous PRESENT   Pregnancy, urine POC     Status: None   Collection Time: 10/26/15 11:04 AM  Result Value Ref Range   Preg Test, Ur NEGATIVE NEGATIVE    Comment:        THE SENSITIVITY OF THIS METHODOLOGY IS >24 mIU/mL   Wet prep, genital     Status: Abnormal   Collection Time: 10/26/15 11:12 AM  Result Value Ref Range   Yeast Wet Prep HPF POC NONE SEEN NONE SEEN   Trich, Wet Prep NONE SEEN NONE SEEN   Clue Cells Wet Prep HPF POC NONE SEEN NONE SEEN   WBC, Wet Prep HPF POC TOO NUMEROUS TO COUNT (A) NONE SEEN   Sperm NONE SEEN     Imaging US Transvaginal  Non-ob  10/26/2015  CLINICAL DATA:  Left pelvic pain EXAM: TRANSABDOMINAL AND TRANSVAGINAL ULTRASOUND OF PELVIS DOPPLER ULTRASOUND OF OVARIES TECHNIQUE: Both transabdominal and transvaginal ultrasound examinations of the pelvis were performed. Transabdominal technique was performed for global imaging of the pelvis including uterus, ovaries, adnexal regions, and pelvic cul-de-sac. It was necessary to proceed with endovaginal  exam following the transabdominal exam to visualize the endometrium. Color and duplex Doppler ultrasound was utilized to evaluate blood flow to the ovaries. COMPARISON:  04/23/2015 FINDINGS: Uterus Measurements: 9 x 4 x 5 cm. No fibroids or other mass visualized. Endometrium Thickness: 4 mm. No focal abnormality visualized (apparent focal echogenic thickening inferiorly and posteriorly seen on image 114 is considered artifactual as no lesion visualized on transverse images or on sequential longitudinal scan -images 90-100). Right ovary Measurements: 29 x 17 x 17 mm. Normal appearance/no adnexal mass. Left ovary Measurements: 26 x 15 x 13 mm. Resolved hemorrhagic cyst seen previously. Normal appearance/no adnexal mass. Pulsed Doppler evaluation of both ovaries demonstrates normal low-resistance arterial and venous waveforms. Other findings No free fluid. IMPRESSION: Negative.  No explanation for left adnexal pain. Electronically Signed   By: Monte Fantasia M.D.   On: 10/26/2015 13:19   US Pelvis Complete  10/26/2015  CLINICAL DATA:  Left pelvic pain EXAM: TRANSABDOMINAL AND TRANSVAGINAL ULTRASOUND OF PELVIS DOPPLER ULTRASOUND OF OVARIES TECHNIQUE: Both transabdominal and transvaginal ultrasound examinations of the pelvis were performed. Transabdominal technique was performed for global imaging of the pelvis including uterus, ovaries, adnexal regions, and pelvic cul-de-sac. It was necessary to proceed with endovaginal exam following the transabdominal exam to visualize the endometrium. Color  and duplex Doppler ultrasound was utilized to evaluate blood flow to the ovaries. COMPARISON:  04/23/2015 FINDINGS: Uterus Measurements: 9 x 4 x 5 cm. No fibroids or other mass visualized. Endometrium Thickness: 4 mm. No focal abnormality visualized (apparent focal echogenic thickening inferiorly and posteriorly seen on image 114 is considered artifactual as no lesion visualized on transverse images or on sequential longitudinal scan -images 90-100). Right ovary Measurements: 29 x 17 x 17 mm. Normal appearance/no adnexal mass. Left ovary Measurements: 26 x 15 x 13 mm. Resolved hemorrhagic cyst seen previously. Normal appearance/no adnexal mass. Pulsed Doppler evaluation of both ovaries demonstrates normal low-resistance arterial and venous waveforms. Other findings No free fluid. IMPRESSION: Negative.  No explanation for left adnexal pain. Electronically Signed   By: Monte Fantasia M.D.   On: 10/26/2015 13:19   [place] Art/ven Flow Abd Pelv Doppler  10/26/2015  CLINICAL DATA:  Left pelvic pain EXAM: TRANSABDOMINAL AND TRANSVAGINAL ULTRASOUND OF PELVIS DOPPLER ULTRASOUND OF OVARIES TECHNIQUE: Both transabdominal and transvaginal ultrasound examinations of the pelvis were performed. Transabdominal technique was performed for global imaging of the pelvis including uterus, ovaries, adnexal regions, and pelvic cul-de-sac. It was necessary to proceed with endovaginal exam following the transabdominal exam to visualize the endometrium. Color and duplex Doppler ultrasound was utilized to evaluate blood flow to the ovaries. COMPARISON:  04/23/2015 FINDINGS: Uterus Measurements: 9 x 4 x 5 cm. No fibroids or other mass visualized. Endometrium Thickness: 4 mm. No focal abnormality visualized (apparent focal echogenic thickening inferiorly and posteriorly seen on image 114 is considered artifactual as no lesion visualized on transverse images or on sequential longitudinal scan -images 90-100). Right ovary Measurements: 29 x 17 x  17 mm. Normal appearance/no adnexal mass. Left ovary Measurements: 26 x 15 x 13 mm. Resolved hemorrhagic cyst seen previously. Normal appearance/no adnexal mass. Pulsed Doppler evaluation of both ovaries demonstrates normal low-resistance arterial and venous waveforms. Other findings No free fluid. IMPRESSION: Negative.  No explanation for left adnexal pain. Electronically Signed   By: Monte Fantasia M.D.   On: 10/26/2015 13:19   Ct Renal Stone Study  10/26/2015  CLINICAL DATA:  Left flank pain since 6:30 a.m. accompanied with nausea. EXAM: CT ABDOMEN  AND PELVIS WITHOUT CONTRAST TECHNIQUE: Multidetector CT imaging of the abdomen and pelvis was performed following the standard protocol without IV contrast. COMPARISON:  None. FINDINGS: Lower chest:  No acute findings.  Lung bases are clear. Hepatobiliary: The liver appears normal. Gallbladder appears normal. No bile duct dilatation. Pancreas: No mass or inflammatory process identified on this un-enhanced exam. Spleen: Within normal limits in size. Adrenals/Urinary Tract: Kidneys are unremarkable without stone or hydronephrosis. No ureteral or bladder calculi identified. Adrenal glands appear normal. Stomach/Bowel: Bowel is normal in caliber. No bowel wall thickening or evidence of bowel wall inflammation. Appendix is normal. Vascular/Lymphatic: No pathologically enlarged lymph nodes. No evidence of abdominal aortic aneurysm. Reproductive: No mass or other acute findings identified within the uterus or adnexal regions. There appear to be 2 surgical clips in the left adnexa, compatible with given history of tubal ligation but suspicious for a displacement of the right adnexal clip. Other: Small amount of free fluid in the cul-de-sac is likely physiologic in nature. No other free fluid seen. No free intraperitoneal air. Musculoskeletal: Small periumbilical abdominal wall hernia which contains fat only. No osseous abnormality. IMPRESSION: 1. There are 2 surgical clips  within the left pelvis, left adnexal region, presumably related to the given history of tubal ligation. This is certainly suspicious for a displacement of the right adnexal ligation clip as there is no additional clip seen within the right pelvis. Would consider GYN consult for further workup considerations. 2. Remainder of the abdomen and pelvis CT is unremarkable. No evidence of acute intra-abdominal or intrapelvic abnormality. No renal or ureteral calculi. No bowel obstruction or evidence of bowel wall inflammation. Appendix is normal. 3. Small periumbilical abdominal wall hernia which contains fat only. No bowel involvement or inflammatory change at this small hernia site. Electronically Signed   By: Franki Cabot M.D.   On: 10/26/2015 14:18    Assessment: 33 y.o. [id number] presenting to ER with abdominal pain with concern raised for migration of filshie clip  Plan:  1) Abdominal/pelvic pain - no clear gyn etiology.  Even if Filshie clip has migrated of tube I would not expect this to present with acute onset abdominal pain.  We discussed that the tubes are not fixed in position and the right tube can cross the midline.  However, to verify correct placement and to rule out endometriosis discussed potential of diagnostic laparoscopy.  Given her family history of ovarian cancer we discussed potential of proceeding with bilateral salpingectomy for ovarian cancer risk reduction at the time of diagnostic laparoscopy.  Given non-acute exam this does not have to be done emergently and will be setup as an outpatient.  I will see the patient back in the next 1-2 weeks to setup procedure.  We also discussed expectant management vs trial of hormonal management for ovarian suppression, also would ensure safeguard should there be tubal patency on the right with clip migration.  Patient is interested in diagnostic laparoscopy

## 2015-10-26 NOTE — ED Provider Notes (Signed)
[facility] Emergency Department Provider Note  ____________________________________________  Time seen: Approximately 10:10 AM  I have reviewed the triage vital signs and the nursing notes.   HISTORY  Chief Complaint Abdominal Pain    HPI Taylor Stephens is a 34 y.o. female with a history of ovarian cysts who is presenting with left-sided abdominal pain that started suddenly at 9 AM this morning. She says she has been nauseous but not vomited. She says the pain is sharp and radiating through to her back. No dysuria. Denies a history of kidney stones. Says that she has had a tubal ligation about 5 years ago.No family history of kidney stones. No vaginal bleeding or discharge.   History reviewed. No pertinent past medical history.  There are no active problems to display for this patient.   Past Surgical History  Procedure Laterality Date  . Tubal ligastion      Current Outpatient Rx  Name  Route  Sig  Dispense  Refill  . cetirizine (ZYRTEC) 10 MG tablet   Oral   Take 10 mg by mouth daily as needed for allergies.         . chlorpheniramine (CHLOR-TRIMETON) 4 MG tablet   Oral   Take 1 tablet (4 mg total) by mouth 2 (two) times daily as needed for allergies or rhinitis. Patient not taking: Reported on 10/26/2015   30 tablet   0   . guaiFENesin-codeine 100-10 MG/5ML syrup   Oral   Take 10 mLs by mouth every 4 (four) hours as needed for cough. Patient not taking: Reported on 10/26/2015   180 mL   0     Allergies Ibuprofen  No family history on file.  Social History Social History  Substance Use Topics  . Smoking status: Never Smoker   . Smokeless tobacco: Never Used  . Alcohol Use: Yes    Review of Systems Constitutional: No fever/chills Eyes: No visual changes. ENT: No sore throat. Cardiovascular: Denies chest pain. Respiratory: Denies shortness of breath. Gastrointestinal: no vomiting.  No diarrhea..    No  constipation. Genitourinary: Negative for dysuria. Musculoskeletal: As above  Skin: Negative for rash. Neurological: Negative for headaches, focal weakness or numbness.  10-point ROS otherwise negative.  ____________________________________________   PHYSICAL EXAM:  VITAL SIGNS: ED Triage Vitals  Enc Vitals Group     BP 10/26/15 0947 127/72 mmHg     Pulse Rate 10/26/15 0947 75     Resp 10/26/15 0947 20     Temp 10/26/15 0947 97.8 F (36.6 C)     Temp Source 10/26/15 0947 Oral     SpO2 10/26/15 0947 98 %     Weight 10/26/15 0947 187 lb (84.823 kg)     Height 10/26/15 0947 5\' 3"  (1.6 m)     Head Cir --      Peak Flow --      Pain Score 10/26/15 0944 10     Pain Loc --      Pain Edu? --      Excl. in GC? --     Constitutional: Alert and oriented. Well appearing and in no acute distress. Eyes: Conjunctivae are normal. PERRL. EOMI. Head: Atraumatic. Nose: No congestion/rhinnorhea. Mouth/Throat: Mucous membranes are moist.  Oropharynx non-erythematous. Neck: No stridor.   Cardiovascular: Normal rate, regular rhythm. Grossly normal heart sounds.  Good peripheral circulation. Respiratory: Normal respiratory effort.  No retractions. Lungs CTAB. Gastrointestinal: Soft with left lower quadrant tenderness which is moderate. No rebound or  guarding. No distention. No abdominal bruits. No CVA tenderness. Genitourinary:  Normal external exam. Speculum exam with a small amount of cervical mucus. No bleeding. Bimanual exam with positive CMT. Mild uterine but moderate to severe left adnexal tenderness without palpation of any masses. Musculoskeletal: No lower extremity tenderness nor edema.  No joint effusions. Neurologic:  Normal speech and language. No gross focal neurologic deficits are appreciated. No gait instability. Skin:  Skin is warm, dry and intact. No rash noted. Psychiatric: Mood and affect are normal. Speech and behavior are  normal.  ____________________________________________   LABS (all labs ordered are listed, but only abnormal results are displayed)  Labs Reviewed  WET PREP, GENITAL - Abnormal; Notable for the following:    WBC, Wet Prep HPF POC TOO NUMEROUS TO COUNT (*)    All other components within normal limits  CBC WITH DIFFERENTIAL/PLATELET - Abnormal; Notable for the following:    Hemoglobin 11.8 (*)    MCV 76.9 (*)    MCH 24.8 (*)    RDW 14.8 (*)    All other components within normal limits  URINALYSIS COMPLETEWITH MICROSCOPIC ([facility] ONLY) - Abnormal; Notable for the following:    Color, Urine YELLOW (*)    APPearance CLEAR (*)    Hgb urine dipstick 1+ (*)    Squamous Epithelial / LPF 0-5 (*)    All other components within normal limits  CHLAMYDIA/NGC RT PCR ([facility] ONLY)  BASIC METABOLIC PANEL  POC URINE PREG, ED  POCT PREGNANCY, URINE   ____________________________________________  EKG   ____________________________________________  RADIOLOGY  Normal pelvic ultrasound. CAT scan of the abdomen and pelvis with 2 surgical clips within the left pelvis which is suspicious for clip migration. Remainder of the exam without any acute pathology. ____________________________________________   PROCEDURES    ____________________________________________   INITIAL IMPRESSION / ASSESSMENT AND PLAN / ED COURSE  Pertinent labs & imaging results that were available during my care of the patient were reviewed by me and considered in my medical decision making (see chart for details).  ----------------------------------------- 4:08 PM on 10/26/2015 -----------------------------------------  Patient resting comfortably at this time with only mild pain after IV pain meds. I discussed the case with Dr. Bonney Aid who evaluated the patient in the emergency department. He says that he will see the patient in the clinic and possibly evaluate her for exploratory laparotomy. It is unclear  what the cause of the patient's pelvic and left lower quadrant abdominal pain is. However, she has a reassuring blood work as well as imaging. She did have a wet prep with 2 many white blood cells count. She does not have a history of sexually transmitted diseases. She says that she does not douche. She says that she has not had anal sex in several months.  Her sexual partners at the bedside.  I discussed with them that they should abstain from any sexual activity until they're both cleared by health care provider after further testing for sexually transmitted diseases. Anderson the plantar one to comply. I'll treat the patient for pelvic inflammatory disease. I do not suspect cardiothoracic cause for this pain. The patient's pain is a lower abdominal. There is no chest pain or shortness of breath associated. The patient is perc negative. ____________________________________________   FINAL CLINICAL IMPRESSION(S) / ED DIAGNOSES  Final diagnoses:  Pelvic pain in female  Pelvic pain in female  Left flank pain   inflammatory disease.    Myrna Blazer, MD 10/26/15 [phone]

## 2015-10-26 NOTE — ED Notes (Signed)
Patient transported to CT 

## 2015-10-26 NOTE — ED Notes (Signed)
Pt states that she woke up this am with some left lower back pain that moved to her left lower quad, pt denies hx of kidney stone, pt denies pain with urination. Pt states that she was diagnosed with an ovarian cyst in may on her left ovary but the pain diminished over the next following months and she states that she didn't require any surgical or pharmaceutical intervention. Pt states that the pain she is having now is sharp and reminds her of contractions and states that the pain radiates from her side down through to her bottom. Pt states that her lmp was a week ago, but shorter than normal only lasting 4 days. Pt reports that her tubes were tied 5 years ago

## 2015-11-15 ENCOUNTER — Encounter
Admission: RE | Admit: 2015-11-15 | Discharge: 2015-11-15 | Disposition: A | Discharge status: Home / Self Care | Payer: Medicaid Other | Source: Ambulatory Visit | Attending: Obstetrics & Gynecology | Admitting: Obstetrics & Gynecology

## 2015-11-15 DIAGNOSIS — Z01812 Encounter for preprocedural laboratory examination: Secondary | ICD-10-CM | POA: Insufficient documentation

## 2015-11-15 LAB — CBC
HEMATOCRIT: 36.3 % (ref 35.0–47.0)
Hemoglobin: 11.4 g/dL — ABNORMAL LOW (ref 12.0–16.0)
MCH: 24.5 pg — ABNORMAL LOW (ref 26.0–34.0)
MCHC: 31.6 g/dL — AB (ref 32.0–36.0)
MCV: 77.6 fL — ABNORMAL LOW (ref 80.0–100.0)
Platelets: 333 10*3/uL (ref 150–440)
RBC: 4.68 MIL/uL (ref 3.80–5.20)
RDW: 15.4 % — AB (ref 11.5–14.5)
WBC: 6 10*3/uL (ref 3.6–11.0)

## 2015-11-15 LAB — TYPE AND SCREEN
ABO/RH(D): A POS
ANTIBODY SCREEN: NEGATIVE

## 2015-11-15 LAB — ABO/RH: ABO/RH(D): A POS

## 2015-11-15 NOTE — Patient Instructions (Signed)
  Your procedure is scheduled on: 11/22/15 Thurs Report to Day Surgery.2nd floor medical mall To find out your arrival time please call [phone] between 1PM - 3PM on 11/21/15 Wed.  Remember: Instructions that are not followed completely may result in serious medical risk, up to and including death, or upon the discretion of your surgeon and anesthesiologist your surgery may need to be rescheduled.    __x__ 1. Do not eat food or drink liquids after midnight. No gum chewing or hard candies.     _x___ 2. No Alcohol for 24 hours before or after surgery.   ____ 3. Bring all medications with you on the day of surgery if instructed.    __x__ 4. Notify your doctor if there is any change in your medical condition     (cold, fever, infections).     Do not wear jewelry, make-up, hairpins, clips or nail polish.  Do not wear lotions, powders, or perfumes. You may wear deodorant.  Do not shave 48 hours prior to surgery. Men may shave face and neck.  Do not bring valuables to the hospital.    [facility] is not responsible for any belongings or valuables.               Contacts, dentures or bridgework may not be worn into surgery.  Leave your suitcase in the car. After surgery it may be brought to your room.  For patients admitted to the hospital, discharge time is determined by your                treatment team.   Patients discharged the day of surgery will not be allowed to drive home.   Please read over the following fact sheets that you were given:      __x__ Take these medicines the morning of surgery with A SIP OF WATER:    1. Percocet if needed  2.   3.   4.  5.  6.  ____ Fleet Enema (as directed)   _x___ Use CHG Soap as directed  ____ Use inhalers on the day of surgery  ____ Stop metformin 2 days prior to surgery    ____ Take 1/2 of usual insulin dose the night before surgery and none on the morning of surgery.   ____ Stop Coumadin/Plavix/aspirin on   ____ Stop  Anti-inflammatories on    ____ Stop supplements until after surgery.    ____ Bring C-Pap to the hospital.

## 2015-11-22 ENCOUNTER — Encounter: Payer: Self-pay | Admitting: Anesthesiology

## 2015-11-22 ENCOUNTER — Ambulatory Visit: Payer: Medicaid Other | Admitting: Certified Registered Nurse Anesthetist

## 2015-11-22 ENCOUNTER — Ambulatory Visit
Admission: RE | Admit: 2015-11-22 | Discharge: 2015-11-22 | Disposition: A | Discharge status: Home / Self Care | Payer: Medicaid Other | Source: Ambulatory Visit | Attending: Obstetrics & Gynecology | Admitting: Obstetrics & Gynecology

## 2015-11-22 ENCOUNTER — Encounter
Admission: RE | Disposition: A | Discharge status: Home / Self Care | Payer: Self-pay | Source: Ambulatory Visit | Attending: Obstetrics & Gynecology

## 2015-11-22 DIAGNOSIS — R1032 Left lower quadrant pain: Secondary | ICD-10-CM | POA: Diagnosis not present

## 2015-11-22 DIAGNOSIS — T199XXA Foreign body in genitourinary tract, part unspecified, initial encounter: Secondary | ICD-10-CM

## 2015-11-22 DIAGNOSIS — F17210 Nicotine dependence, cigarettes, uncomplicated: Secondary | ICD-10-CM | POA: Insufficient documentation

## 2015-11-22 DIAGNOSIS — Z803 Family history of malignant neoplasm of breast: Secondary | ICD-10-CM | POA: Insufficient documentation

## 2015-11-22 DIAGNOSIS — Z85828 Personal history of other malignant neoplasm of skin: Secondary | ICD-10-CM | POA: Diagnosis not present

## 2015-11-22 DIAGNOSIS — Z886 Allergy status to analgesic agent status: Secondary | ICD-10-CM | POA: Diagnosis not present

## 2015-11-22 DIAGNOSIS — M795 Residual foreign body in soft tissue: Secondary | ICD-10-CM | POA: Diagnosis not present

## 2015-11-22 DIAGNOSIS — R102 Pelvic and perineal pain: Secondary | ICD-10-CM | POA: Diagnosis present

## 2015-11-22 HISTORY — PX: LAPAROSCOPY: SHX197

## 2015-11-22 SURGERY — LAPAROSCOPY, DIAGNOSTIC
Anesthesia: General | Wound class: Clean Contaminated

## 2015-11-22 MED ORDER — SUCCINYLCHOLINE CHLORIDE 20 MG/ML IJ SOLN
INTRAMUSCULAR | Status: DC | PRN
  Administered 2015-11-22: 140 mg via INTRAVENOUS

## 2015-11-22 MED ORDER — FENTANYL CITRATE (PF) 100 MCG/2ML IJ SOLN
INTRAMUSCULAR | Status: DC | PRN
  Administered 2015-11-22 (×2): 50 ug via INTRAVENOUS

## 2015-11-22 MED ORDER — LIDOCAINE HCL (PF) 1 % IJ SOLN
INTRAMUSCULAR | Status: AC
  Filled 2015-11-22: qty 2

## 2015-11-22 MED ORDER — ACETAMINOPHEN 650 MG RE SUPP: 650.0000 mg | RECTAL | Status: DC | PRN

## 2015-11-22 MED ORDER — GLYCOPYRROLATE 0.2 MG/ML IJ SOLN
INTRAMUSCULAR | Status: DC | PRN
  Administered 2015-11-22: 0.2 mg via INTRAVENOUS
  Administered 2015-11-22: 0.4 mg via INTRAVENOUS

## 2015-11-22 MED ORDER — LIDOCAINE HCL (CARDIAC) 20 MG/ML IV SOLN
INTRAVENOUS | Status: DC | PRN
  Administered 2015-11-22: 100 mg via INTRAVENOUS

## 2015-11-22 MED ORDER — ROCURONIUM BROMIDE 100 MG/10ML IV SOLN
INTRAVENOUS | Status: DC | PRN
  Administered 2015-11-22: 5 mg via INTRAVENOUS
  Administered 2015-11-22: 15 mg via INTRAVENOUS

## 2015-11-22 MED ORDER — ACETAMINOPHEN 10 MG/ML IV SOLN
INTRAVENOUS | Status: DC | PRN
  Administered 2015-11-22: 1000 mg via INTRAVENOUS

## 2015-11-22 MED ORDER — DEXAMETHASONE SODIUM PHOSPHATE 10 MG/ML IJ SOLN
INTRAMUSCULAR | Status: DC | PRN
  Administered 2015-11-22: 5 mg via INTRAVENOUS

## 2015-11-22 MED ORDER — OXYCODONE-ACETAMINOPHEN 5-325 MG PO TABS: 1.0000 | ORAL_TABLET | ORAL | Status: DC | PRN

## 2015-11-22 MED ORDER — NEOSTIGMINE METHYLSULFATE 10 MG/10ML IV SOLN
INTRAVENOUS | Status: DC | PRN
  Administered 2015-11-22: 3 mg via INTRAVENOUS

## 2015-11-22 MED ORDER — BUPIVACAINE HCL (PF) 0.5 % IJ SOLN
INTRAMUSCULAR | Status: AC
  Filled 2015-11-22: qty 30

## 2015-11-22 MED ORDER — LACTATED RINGERS IV SOLN
INTRAVENOUS | Status: DC
  Administered 2015-11-22: 11:00:00 via INTRAVENOUS

## 2015-11-22 MED ORDER — FENTANYL CITRATE (PF) 100 MCG/2ML IJ SOLN
25.0000 ug | INTRAMUSCULAR | Status: DC | PRN
  Administered 2015-11-22 (×4): 25 ug via INTRAVENOUS

## 2015-11-22 MED ORDER — FENTANYL CITRATE (PF) 100 MCG/2ML IJ SOLN
INTRAMUSCULAR | Status: AC
  Administered 2015-11-22: 25 ug via INTRAVENOUS
  Filled 2015-11-22: qty 2

## 2015-11-22 MED ORDER — MIDAZOLAM HCL 2 MG/2ML IJ SOLN
INTRAMUSCULAR | Status: DC | PRN
  Administered 2015-11-22: 2 mg via INTRAVENOUS

## 2015-11-22 MED ORDER — OXYCODONE-ACETAMINOPHEN 5-325 MG PO TABS
ORAL_TABLET | ORAL | Status: AC
  Filled 2015-11-22: qty 1

## 2015-11-22 MED ORDER — PROPOFOL 10 MG/ML IV BOLUS
INTRAVENOUS | Status: DC | PRN
  Administered 2015-11-22: 200 mg via INTRAVENOUS

## 2015-11-22 MED ORDER — ACETAMINOPHEN 10 MG/ML IV SOLN
INTRAVENOUS | Status: AC
  Filled 2015-11-22: qty 100

## 2015-11-22 MED ORDER — BUPIVACAINE HCL 0.5 % IJ SOLN
INTRAMUSCULAR | Status: DC | PRN
  Administered 2015-11-22: 3 mL

## 2015-11-22 MED ORDER — ACETAMINOPHEN 325 MG PO TABS: 650.0000 mg | ORAL_TABLET | ORAL | Status: DC | PRN

## 2015-11-22 MED ORDER — ONDANSETRON HCL 4 MG/2ML IJ SOLN
INTRAMUSCULAR | Status: DC | PRN
  Administered 2015-11-22: 4 mg via INTRAVENOUS

## 2015-11-22 MED ORDER — FAMOTIDINE 20 MG PO TABS
ORAL_TABLET | ORAL | Status: AC
  Administered 2015-11-22: 20 mg via ORAL
  Filled 2015-11-22: qty 1

## 2015-11-22 MED ORDER — PROMETHAZINE HCL 25 MG/ML IJ SOLN: 6.2500 mg | INTRAMUSCULAR | Status: DC | PRN

## 2015-11-22 MED ORDER — OXYCODONE-ACETAMINOPHEN 5-325 MG PO TABS
1.0000 | ORAL_TABLET | ORAL | Status: DC | PRN
  Administered 2015-11-22: 1 via ORAL

## 2015-11-22 MED ORDER — FAMOTIDINE 20 MG PO TABS
20.0000 mg | ORAL_TABLET | Freq: Once | ORAL | Status: AC
Start: 2015-11-22 — End: 2015-11-22
  Administered 2015-11-22: 20 mg via ORAL

## 2015-11-22 SURGICAL SUPPLY — 34 items
BAG COUNTER SPONGE EZ (MISCELLANEOUS) ×2 IMPLANT
BLADE SURG SZ11 CARB STEEL (BLADE) ×3 IMPLANT
CANISTER SUCT 1200ML W/VALVE (MISCELLANEOUS) ×3 IMPLANT
CATH ROBINSON RED A/P 16FR (CATHETERS) ×3 IMPLANT
CHLORAPREP W/TINT 26ML (MISCELLANEOUS) ×3 IMPLANT
COUNTER SPONGE BAG EZ (MISCELLANEOUS) ×1
DRSG TEGADERM 2-3/8X2-3/4 SM (GAUZE/BANDAGES/DRESSINGS) ×9 IMPLANT
GAUZE SPONGE NON-WVN 2X2 STRL (MISCELLANEOUS) ×2 IMPLANT
GLOVE BIO SURGEON STRL SZ8 (GLOVE) ×9 IMPLANT
GLOVE INDICATOR 8.0 STRL GRN (GLOVE) ×9 IMPLANT
GOWN STRL REUS W/ TWL LRG LVL3 (GOWN DISPOSABLE) ×1 IMPLANT
GOWN STRL REUS W/ TWL XL LVL3 (GOWN DISPOSABLE) ×1 IMPLANT
GOWN STRL REUS W/TWL LRG LVL3 (GOWN DISPOSABLE) ×2
GOWN STRL REUS W/TWL XL LVL3 (GOWN DISPOSABLE) ×2
IRRIGATION STRYKERFLOW (MISCELLANEOUS) IMPLANT
IRRIGATOR STRYKERFLOW (MISCELLANEOUS)
IV LACTATED RINGERS 1000ML (IV SOLUTION) ×3 IMPLANT
LABEL OR SOLS (LABEL) ×3 IMPLANT
LIQUID BAND (GAUZE/BANDAGES/DRESSINGS) ×3 IMPLANT
NEEDLE VERESS 14GA 120MM (NEEDLE) ×3 IMPLANT
NS IRRIG 500ML POUR BTL (IV SOLUTION) ×3 IMPLANT
PACK GYN LAPAROSCOPIC (MISCELLANEOUS) ×3 IMPLANT
PAD OB MATERNITY 4.3X12.25 (PERSONAL CARE ITEMS) ×3 IMPLANT
PAD PREP 24X41 OB/GYN DISP (PERSONAL CARE ITEMS) ×3 IMPLANT
SCISSORS METZENBAUM CVD 33 (INSTRUMENTS) IMPLANT
SHEARS HARMONIC ACE PLUS 36CM (ENDOMECHANICALS) IMPLANT
SLEEVE ENDOPATH XCEL 5M (ENDOMECHANICALS) ×3 IMPLANT
SPONGE VERSALON 2X2 STRL (MISCELLANEOUS) ×4
SUT VIC AB 2-0 UR6 27 (SUTURE) IMPLANT
SUT VIC AB 4-0 PS2 18 (SUTURE) IMPLANT
SYRINGE 10CC LL (SYRINGE) ×6 IMPLANT
TROCAR ENDO BLADELESS 11MM (ENDOMECHANICALS) IMPLANT
TROCAR XCEL NON-BLD 5MMX100MML (ENDOMECHANICALS) ×3 IMPLANT
TUBING INSUFFLATOR HI FLOW (MISCELLANEOUS) ×3 IMPLANT

## 2015-11-22 NOTE — Anesthesia Preprocedure Evaluation (Addendum)
Anesthesia Evaluation  Patient identified by MRN, date of birth, ID band Patient awake    Reviewed: Allergy & Precautions, H&P , NPO status , Patient's Chart, lab work & pertinent test results, reviewed documented beta blocker date and time   Airway Mallampati: II  TM Distance: >3 FB Neck ROM: full    Dental  (+) Teeth Intact   Pulmonary neg pulmonary ROS, Current Smoker,    Pulmonary exam normal        Cardiovascular negative cardio ROS Normal cardiovascular exam Rhythm:regular Rate:Normal     Neuro/Psych negative neurological ROS  negative psych ROS   GI/Hepatic negative GI ROS, Neg liver ROS,   Endo/Other  negative endocrine ROS  Renal/GU negative Renal ROS  negative genitourinary   Musculoskeletal   Abdominal   Peds  Hematology negative hematology ROS (+)   Anesthesia Other Findings   Reproductive/Obstetrics negative OB ROS                             Anesthesia Physical Anesthesia Plan  ASA: II  Anesthesia Plan: General ETT   Post-op Pain Management:    Induction:   Airway Management Planned:   Additional Equipment:   Intra-op Plan:   Post-operative Plan:   Informed Consent: I have reviewed the patients History and Physical, chart, labs and discussed the procedure including the risks, benefits and alternatives for the proposed anesthesia with the patient or authorized representative who has indicated his/her understanding and acceptance.     Plan Discussed with: CRNA  Anesthesia Plan Comments:         Anesthesia Quick Evaluation  

## 2015-11-22 NOTE — Transfer of Care (Signed)
Immediate Anesthesia Transfer of Care Note  Patient: Taylor Stephens  Procedure(s) Performed: Procedure(s): LAPAROSCOPY DIAGNOSTIC--removal of foreign body (N/A)  Patient Location: PACU  Anesthesia Type:General  Level of Consciousness: sedated  Airway & Oxygen Therapy: Patient Spontanous Breathing and Patient connected to nasal cannula oxygen  Post-op Assessment: Report given to RN and Post -op Vital signs reviewed and stable  Post vital signs: Reviewed and stable  Last Vitals:  Filed Vitals:   11/22/15 1016  BP: 131/71  Pulse: 83  Temp: 36.9 C  Resp: 16    Complications: No apparent anesthesia complications and Patient re-intubated

## 2015-11-22 NOTE — Discharge Instructions (Signed)
General Gynecological Post-Operative Instructions °You may expect to feel dizzy, weak, and drowsy for as long as 24 hours after receiving the medicine that made you sleep (anesthetic).  °Do not drive a car, ride a bicycle, participate in physical activities, or take public transportation until you are done taking narcotic pain medicines or as directed by your doctor.  °Do not drink alcohol or take tranquilizers.  °Do not take medicine that has not been prescribed by your doctor.  °Do not sign important papers or make important decisions while on narcotic pain medicines.  °Have a responsible person with you.  °CARE OF INCISION  °Keep incision clean and dry. °Take showers instead of baths until your doctor gives you permission to take baths.  °Avoid heavy lifting (more than 10 pounds/4.5 kilograms), pushing, or pulling.  °Avoid activities that may risk injury to your surgical site.  °No sexual intercourse or placement of anything in the vagina for 1 weeks or as instructed by your doctor. °If you have tubes coming from the wound site, check with your doctor regarding appropriate care of the tubes. °Only take prescription or over-the-counter medicines  for pain, discomfort, or fever as directed by your doctor. Do not take aspirin. It can make you bleed. Take medicines (antibiotics) that kill germs if they are prescribed for you.  °Call the office or go to the ER if:  °You feel sick to your stomach (nauseous) and you start to throw up (vomit).  °You have trouble eating or drinking.  °You have an oral temperature above 101.  °You have constipation that is not helped by adjusting diet or increasing fluid intake. Pain medicines are a common cause of constipation.  °You have any other concerns. °SEEK IMMEDIATE MEDICAL CARE IF:  °You have persistent dizziness.  °You have difficulty breathing or a congested sounding (croupy) cough.  °You have an oral temperature above 102.5, not controlled by medicine.  °There is increasing  pain or tenderness near or in the surgical site.  ° ° °AMBULATORY SURGERY  °DISCHARGE INSTRUCTIONS ° ° °1) The drugs that you were given will stay in your system until tomorrow so for the next 24 hours you should not: ° °A) Drive an automobile °B) Make any legal decisions °C) Drink any alcoholic beverage ° ° °2) You may resume regular meals tomorrow.  Today it is better to start with liquids and gradually work up to solid foods. ° °You may eat anything you prefer, but it is better to start with liquids, then soup and crackers, and gradually work up to solid foods. ° ° °3) Please notify your doctor immediately if you have any unusual bleeding, trouble breathing, redness and pain at the surgery site, drainage, fever, or pain not relieved by medication. °4)  ° °5) Your post-operative visit with Dr.                     °           °     is: Date:                        Time:   ° °Please call to schedule your post-operative visit. ° °6) Additional Instructions: °7)  °

## 2015-11-22 NOTE — Anesthesia Procedure Notes (Signed)
Procedure Name: Intubation Date/Time: 11/22/2015 11:41 AM Performed by: Ginger CarneMICHELET, Apryl Brymer Pre-anesthesia Checklist: Patient identified, Emergency Drugs available, Suction available and Patient being monitored Patient Re-evaluated:Patient Re-evaluated prior to inductionOxygen Delivery Method: Circle system utilized Preoxygenation: Pre-oxygenation with 100% oxygen Intubation Type: IV induction Ventilation: Mask ventilation without difficulty Laryngoscope Size: Miller and 2 Grade View: Grade I Tube type: Oral Tube size: 7.5 mm Number of attempts: 1 Airway Equipment and Method: Stylet Placement Confirmation: ETT inserted through vocal cords under direct vision,  positive ETCO2 and breath sounds checked- equal and bilateral Secured at: 22 cm Tube secured with: Tape Dental Injury: Teeth and Oropharynx as per pre-operative assessment

## 2015-11-22 NOTE — Anesthesia Postprocedure Evaluation (Signed)
Anesthesia Post Note  Patient: Leo RodKatrina S Sinopoli  Procedure(s) Performed: Procedure(s) (LRB): LAPAROSCOPY DIAGNOSTIC--removal of foreign body (N/A)  Patient location during evaluation: PACU Anesthesia Type: General Level of consciousness: awake and alert Pain management: pain level controlled Vital Signs Assessment: post-procedure vital signs reviewed and stable Respiratory status: spontaneous breathing, nonlabored ventilation, respiratory function stable and patient connected to nasal cannula oxygen Cardiovascular status: blood pressure returned to baseline and stable Postop Assessment: no signs of nausea or vomiting Anesthetic complications: no    Last Vitals:  Filed Vitals:   11/22/15 1323 11/22/15 1345  BP: 118/75 120/85  Pulse: 60 68  Temp:    Resp: 16 18    Last Pain:  Filed Vitals:   11/22/15 1353  PainSc: 3                  Lenard SimmerAndrew William Laske

## 2015-11-22 NOTE — Op Note (Signed)
  Operative Note   11/22/2015  PRE-OP DIAGNOSIS: Pelvic pain, LLQ pain    POST-OP DIAGNOSIS: same, foreign body along GU tract   PROCEDURE: Procedure(s): LAPAROSCOPY DIAGNOSTIC--removal of foreign body   SURGEON: Annamarie MajorPaul Harris, MD, FACOG  ANESTHESIA: Choice   ESTIMATED BLOOD LOSS: Min, <10 mL  COMPLICATIONS: None  DISPOSITION: PACU - hemodynamically stable.  CONDITION: stable  FINDINGS: Laparoscopic survey of the abdomen revealed a grossly normal uterus, tubes, ovaries, liver edge, gallbladder edge and appendix, No intra-abdominal adhesions were noted.  Two Filshie Clips were found both migrated off of tubes and in Left ovarian Fossa with mild filmy adhesions over them  PROCEDURE IN DETAIL: The patient was taken to the OR where anesthesia was administed. The patient was positioned in dorsal lithotomy in the StavesAllen stirrups. The patient was then examined under anesthesia with the above noted findings. The patient was prepped and draped in the normal sterile fashion and foley catheter was placed. A Graves speculum was placed in the vagina and the anterior lip of the cervix was grasped with a single toothed tenaculum. A Hulka uterine manipulator was then inserted in the uterus. Uterine mobility was found to be satisfactory. The speculum was then removed.  Attention was turned to the patient's abdomen where a 5 mm skin incision was made in the umbilical fold, after injection of local anesthesia. The Veress step needle was carefully introduced into the peritoneal cavity with placement confirmed using the hanging drop technique.  Pneumoperitoneum was obtained. The 5 mm port was then placed under direct visualization with the operative laparoscope.  Trendelenburg positioning.  Additional 5 mm trocar was then placed in the RLQ lateral to the inferior epigastric blood vessels under direct visualization with the laparoscope.  Instrumentation to visualize complete pelvic anatomy performed.    The  filshie clips both seen in the left ovarian fossa are carefully dissected free and removed without difficulty.  No bleeding.  No other evidence for adhesions, endometriosis, pelvic congestion, fibroids, or cysts.   Instruments and trocars removed, gas expelled, and skin closed with skin adhesive glue.  Instrument, needle, and sponge counts correct x2 at the conclusion of the case.  Pt goes to recovery room in stable condition.

## 2015-11-22 NOTE — H&P (Signed)
History and Physical Interval Note:  11/22/2015 11:03 AM  Taylor Stephens  has presented today for surgery, with the diagnosis of LLQ PAIN  The various methods of treatment have been discussed with the patient and family. After consideration of risks, benefits and other options for treatment, the patient has consented to  Procedure(s): LAPAROSCOPY DIAGNOSTIC (N/A) as a surgical intervention .  The patient's history has been reviewed, patient examined, no change in status, stable for surgery.  Pt has the following beta blocker history-  Not taking Beta Blocker.  I have reviewed the patient's chart and labs.  Questions were answered to the patient's satisfaction.    Jadalee Westcott Renae FicklePAUL

## 2015-11-26 LAB — POCT PREGNANCY, URINE: Preg Test, Ur: NEGATIVE

## 2015-12-07 ENCOUNTER — Other Ambulatory Visit: Payer: Self-pay | Admitting: Obstetrics & Gynecology

## 2015-12-07 DIAGNOSIS — Z9851 Tubal ligation status: Secondary | ICD-10-CM

## 2015-12-27 ENCOUNTER — Ambulatory Visit
Admission: RE | Admit: 2015-12-27 | Discharge: 2015-12-27 | Disposition: A | Discharge status: Home / Self Care | Payer: Medicaid Other | Source: Ambulatory Visit | Attending: Obstetrics & Gynecology | Admitting: Obstetrics & Gynecology

## 2015-12-27 DIAGNOSIS — Z9851 Tubal ligation status: Secondary | ICD-10-CM | POA: Insufficient documentation

## 2015-12-27 NOTE — Procedures (Signed)
Prep and drape done.  Cervix normal.  Tenaculum placed. Cervix dilated minimally.  Catheter placed into uterine cavity. Approximately 20 mL dye injected under fluoroscopic visualization. Obstructed  bilateral fallopian tubes seen. Dye to mid-portion of each tube with no further spread and no spillage seen. Catheter and tenaculum removed.  Pt stable, tolerated procedure well.

## 2016-12-28 ENCOUNTER — Emergency Department
Admission: EM | Admit: 2016-12-28 | Discharge: 2016-12-28 | Disposition: A | Discharge status: Home / Self Care | Payer: Medicaid Other | Attending: Emergency Medicine | Admitting: Emergency Medicine

## 2016-12-28 DIAGNOSIS — R112 Nausea with vomiting, unspecified: Secondary | ICD-10-CM | POA: Diagnosis present

## 2016-12-28 DIAGNOSIS — J111 Influenza due to unidentified influenza virus with other respiratory manifestations: Secondary | ICD-10-CM | POA: Diagnosis not present

## 2016-12-28 MED ORDER — OSELTAMIVIR PHOSPHATE 75 MG PO CAPS: 75.0000 mg | ORAL_CAPSULE | Freq: Two times a day (BID) | ORAL | 0 refills | Status: AC

## 2016-12-28 NOTE — ED Provider Notes (Signed)
[facility] Emergency Department Provider Note  ____________________________________________  Time seen: Approximately 4:59 PM  I have reviewed the triage vital signs and the nursing notes.   HISTORY  Chief Complaint Generalized Body Aches; Emesis; and Chills    HPI Taylor Stephens is a 36 y.o. female presenting to the emergency department with headache, nausea, vomiting and myalgias that started today. Patient has had sick contacts at work. She is tolerating fluids by mouth and her own secretions. She has had diminished appetite. No recent travel. She has been afebrile. Patient has taken Tylenol but has attempted no other alleviating measures. She denies chest pain, chest tightness, shortness of breath, abdominal pain, dysuria and hematuria. She takes no medications daily and she states that she has been otherwise healthy. Patient works as a Tree surgeon.   No past medical history on file.  Patient Active Problem List   Diagnosis Date Noted  . Acute pelvic pain, female 11/22/2015  . Foreign body in site in genitourinary tract 11/22/2015    Past Surgical History:  Procedure Laterality Date  . LAPAROSCOPY N/A 11/22/2015   Procedure: LAPAROSCOPY DIAGNOSTIC--removal of foreign body;  Surgeon: Nadara Mustard, MD;  Location: [facility] ORS;  Service: Gynecology;  Laterality: N/A;  . MOLE REMOVAL    . tubal ligastion      Prior to Admission medications   Medication Sig Start Date End Date Taking? Authorizing Provider  cetirizine (ZYRTEC) 10 MG tablet Take 10 mg by mouth daily as needed for allergies.    Historical Provider, MD  oxyCODONE-acetaminophen (PERCOCET) 5-325 MG tablet Take 1 tablet by mouth every 4 (four) hours as needed for moderate pain or severe pain. 11/22/15   Nadara Mustard, MD  oxyCODONE-acetaminophen (PERCOCET/ROXICET) 5-325 MG tablet Take 1 tablet by mouth every 4 (four) hours as needed for severe pain.    Historical Provider, MD     Allergies Ibuprofen  No family history on file.  Social History Social History  Substance Use Topics  . Smoking status: Never Smoker  . Smokeless tobacco: Never Used  . Alcohol use Yes     Comment: occ.    Review of Systems  Constitutional: Patient has been afebrile.  Eyes: No visual changes. No discharge ENT: Patient has had congestion but no rhinorrhea..  Cardiovascular: no chest pain. Respiratory: No cough. No SOB. Gastrointestinal: Patient has had nausea and vomiting. Genitourinary: Negative for dysuria. No hematuria Musculoskeletal: Patient has had myalgias. Skin: Negative for rash, abrasions, lacerations, ecchymosis. Neurological: Patient has had headache, no focal weakness or numbness.  ____________________________________________   PHYSICAL EXAM:  VITAL SIGNS: ED Triage Vitals  Enc Vitals Group     BP 12/28/16 1538 111/74     Pulse Rate 12/28/16 1538 69     Resp 12/28/16 1538 18     Temp 12/28/16 1538 98.7 F (37.1 C)     Temp Source 12/28/16 1538 Oral     SpO2 12/28/16 1538 100 %     Weight 12/28/16 1538 184 lb (83.5 kg)     Height 12/28/16 1538 5\' 3"  (1.6 m)     Head Circumference --      Peak Flow --      Pain Score 12/28/16 1539 8     Pain Loc --      Pain Edu? --      Excl. in GC? --     Constitutional: Alert and oriented. Patient is lying supine in bed.  Eyes: Conjunctivae are normal. PERRL. EOMI. Head:  Atraumatic. ENT:      Ears: Tympanic membranes are injected bilaterally without evidence of effusion or purulent exudate. Bony landmarks are visualized bilaterally. No pain with palpation at the tragus.      Nose: Nasal turbinates are non-edematous.       Mouth/Throat: Mucous membranes are moist. Posterior pharynx is non- erythematous. No tonsillar hypertrophy or purulent exudate. Uvula is midline. Neck: Full range of motion. No pain is elicited with flexion at the neck. Hematological/Lymphatic/Immunilogical: No cervical  lymphadenopathy. Cardiovascular: Normal rate, regular rhythm. Normal S1 and S2.  Good peripheral circulation. Respiratory: Normal respiratory effort without tachypnea or retractions. Lungs CTAB. Good air entry to the bases with no decreased or absent breath sounds. Gastrointestinal: Bowel sounds 4 quadrants. Soft and nontender to palpation. No guarding or rigidity. No palpable masses. No distention. No CVA tenderness.  Skin:  Skin is warm, dry and intact. No rash noted. Psychiatric: Mood and affect are normal. Speech and behavior are normal. Patient exhibits appropriate insight and judgement. ____________________________________________   LABS (all labs ordered are listed, but only abnormal results are displayed)  Labs Reviewed - No data to display ____________________________________________  EKG   ____________________________________________  RADIOLOGY  No results found.  ____________________________________________    PROCEDURES  Procedure(s) performed:    Procedures    Medications - No data to display   ____________________________________________   INITIAL IMPRESSION / ASSESSMENT AND PLAN / ED COURSE  Pertinent labs & imaging results that were available during my care of the patient were reviewed by me and considered in my medical decision making (see chart for details).  Review of the [place] CSRS was performed in accordance of the [facility] prior to dispensing any controlled drugs.    Assessment and Plan: Influenza:  Patient presents the emergency department with headache, emesis and myalgias. Symptoms are consistent with early influenza. Patient was discharged with Tamiflu. Rest and hydration were encouraged. Patient was advised to follow-up with her primary care provider in one week. Vital signs and physical exam are reassuring at this time. Patient feels comfortable being discharged from the emergency department. All patient questions were  answered.  ____________________________________________  FINAL CLINICAL IMPRESSION(S) / ED DIAGNOSES  Final diagnoses:  None      NEW MEDICATIONS STARTED DURING THIS VISIT:  New Prescriptions   No medications on file        This chart was dictated using voice recognition software/Dragon. Despite best efforts to proofread, errors can occur which can change the meaning. Any change was purely unintentional.    Orvil FeilJaclyn M Gust Eugene, PA-C 12/28/16 1823    Arnaldo NatalPaul F Malinda, MD 12/28/16 [phone]

## 2016-12-28 NOTE — ED Triage Notes (Signed)
Pt presents to ED c/o flu like symptoms: body aches, chills, and weakness since this morning

## 2017-06-26 ENCOUNTER — Emergency Department
Admission: EM | Admit: 2017-06-26 | Discharge: 2017-06-26 | Disposition: A | Discharge status: Home / Self Care | Payer: Medicaid Other | Attending: Emergency Medicine | Admitting: Emergency Medicine

## 2017-06-26 ENCOUNTER — Emergency Department: Payer: Medicaid Other

## 2017-06-26 DIAGNOSIS — R51 Headache: Secondary | ICD-10-CM | POA: Insufficient documentation

## 2017-06-26 DIAGNOSIS — Y9389 Activity, other specified: Secondary | ICD-10-CM | POA: Diagnosis not present

## 2017-06-26 DIAGNOSIS — S39012A Strain of muscle, fascia and tendon of lower back, initial encounter: Secondary | ICD-10-CM

## 2017-06-26 DIAGNOSIS — S161XXA Strain of muscle, fascia and tendon at neck level, initial encounter: Secondary | ICD-10-CM | POA: Diagnosis not present

## 2017-06-26 DIAGNOSIS — W2210XA Striking against or struck by unspecified automobile airbag, initial encounter: Secondary | ICD-10-CM | POA: Insufficient documentation

## 2017-06-26 DIAGNOSIS — Y998 Other external cause status: Secondary | ICD-10-CM | POA: Insufficient documentation

## 2017-06-26 DIAGNOSIS — Y92410 Unspecified street and highway as the place of occurrence of the external cause: Secondary | ICD-10-CM | POA: Diagnosis not present

## 2017-06-26 DIAGNOSIS — S199XXA Unspecified injury of neck, initial encounter: Secondary | ICD-10-CM | POA: Diagnosis present

## 2017-06-26 DIAGNOSIS — S8001XA Contusion of right knee, initial encounter: Secondary | ICD-10-CM | POA: Diagnosis not present

## 2017-06-26 MED ORDER — PREDNISONE 10 MG (21) PO TBPK: ORAL_TABLET | ORAL | 0 refills | Status: DC

## 2017-06-26 MED ORDER — DIAZEPAM 5 MG PO TABS: 5.0000 mg | ORAL_TABLET | Freq: Three times a day (TID) | ORAL | 0 refills | Status: AC | PRN

## 2017-06-26 MED ORDER — DEXAMETHASONE SODIUM PHOSPHATE 10 MG/ML IJ SOLN
10.0000 mg | Freq: Once | INTRAMUSCULAR | Status: AC
  Administered 2017-06-26: 10 mg via INTRAMUSCULAR
  Filled 2017-06-26: qty 1

## 2017-06-26 MED ORDER — DIAZEPAM 5 MG PO TABS
5.0000 mg | ORAL_TABLET | Freq: Once | ORAL | Status: AC
  Administered 2017-06-26: 5 mg via ORAL
  Filled 2017-06-26: qty 1

## 2017-06-26 NOTE — Discharge Instructions (Signed)
Take medication as prescribed. Return to emergency department if symptoms worsen and follow-up with PCP as needed.    While taking the prednisone taper do not take over-the-counter NSAIDs such as ibuprofen, Aleve, Advil or Motrin. When she complete the course of prednisone you may resume taking these medications as needed.

## 2017-06-26 NOTE — ED Notes (Signed)
Pt reports tubes tied and having periods att, CT notified
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
## 2017-06-26 NOTE — ED Notes (Signed)
Reviewed d/c instructions, follow-up care, prescriptions, use of ice/elevation with patient. Pt verbalized understanding.  

## 2017-07-02 ENCOUNTER — Ambulatory Visit
Admission: EM | Admit: 2017-07-02 | Discharge: 2017-07-02 | Disposition: A | Discharge status: Home / Self Care | Payer: No Typology Code available for payment source | Attending: Family Medicine | Admitting: Family Medicine

## 2017-07-02 ENCOUNTER — Telehealth: Payer: Self-pay

## 2017-07-02 DIAGNOSIS — S060X9D Concussion with loss of consciousness of unspecified duration, subsequent encounter: Secondary | ICD-10-CM

## 2017-07-02 DIAGNOSIS — M545 Low back pain, unspecified: Secondary | ICD-10-CM

## 2017-07-02 DIAGNOSIS — F418 Other specified anxiety disorders: Secondary | ICD-10-CM

## 2017-07-02 DIAGNOSIS — M25561 Pain in right knee: Secondary | ICD-10-CM | POA: Diagnosis not present

## 2017-07-02 DIAGNOSIS — S060X9A Concussion with loss of consciousness of unspecified duration, initial encounter: Secondary | ICD-10-CM

## 2017-07-02 DIAGNOSIS — F430 Acute stress reaction: Secondary | ICD-10-CM

## 2017-07-02 DIAGNOSIS — T148XXA Other injury of unspecified body region, initial encounter: Secondary | ICD-10-CM

## 2017-07-02 MED ORDER — CYCLOBENZAPRINE HCL 10 MG PO TABS: 10.0000 mg | ORAL_TABLET | Freq: Every evening | ORAL | 0 refills | Status: DC | PRN

## 2017-07-02 MED ORDER — HYDROXYZINE HCL 25 MG PO TABS: 25.0000 mg | ORAL_TABLET | Freq: Two times a day (BID) | ORAL | 0 refills | Status: DC | PRN

## 2017-07-02 NOTE — Discharge Instructions (Signed)
Take medication as prescribed. Rest. Drink plenty of fluids. Stretch.  Follow up with concussion clinic Tuesday as scheduled.   Follow up with orthopedic for continued knee or back pain as discussed.   Follow up with your primary care physician this week as needed. Return to Urgent care for new or worsening concerns.
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
## 2017-07-07 ENCOUNTER — Ambulatory Visit (INDEPENDENT_AMBULATORY_CARE_PROVIDER_SITE_OTHER): Payer: Medicaid Other | Admitting: Family Medicine

## 2017-07-07 ENCOUNTER — Encounter: Payer: Self-pay | Admitting: Family Medicine

## 2017-07-07 DIAGNOSIS — S060X9A Concussion with loss of consciousness of unspecified duration, initial encounter: Secondary | ICD-10-CM | POA: Insufficient documentation

## 2017-07-07 DIAGNOSIS — S060X0A Concussion without loss of consciousness, initial encounter: Secondary | ICD-10-CM | POA: Diagnosis not present

## 2017-07-07 DIAGNOSIS — S060XAA Concussion with loss of consciousness status unknown, initial encounter: Secondary | ICD-10-CM | POA: Insufficient documentation

## 2017-07-07 NOTE — Patient Instructions (Signed)
Good to see you I do think you have a concussion.  I would like you to limit screen time (including your phone) to 30 minutes daily outside of homework.  No sport until you are back in school with no symptoms. OK to walk slowly.  In addition to this I recommend......  To help improve COGNITIVE function: Using fish oil/omega 3 that is 1000 mg (or roughly 600 mg EPA/DHA), starting as soon as possible after concussion, take: 3 tabs TWICE DAILY  for the next 3 days, then 3 tabs ONCE DAILY  for the next 10 days  Also want iron 65mg  with 500mg  of vitamin C daily watch for constipation    To help reduce HEADACHES: Magnesium oxide 400mg  ONCE - TWICE DAILY May stop after headaches are resolved.                                                                                             To help with INSOMNIA: Melatonin 3-5mg  AT BEDTIME  Can take the hydroxyzine up to 3 times a day for anxiety if you really need.     I want to see you again within 2 weeks. We may retest but see how you are doing

## 2017-07-07 NOTE — Assessment & Plan Note (Signed)
Likely no loss of consciousness. Seems to be doing relatively well. Going back to school at this moment. I do think that there is some anxiety that is contributing. We will continue to monitor. Patient come back and see me again in 2 weeks. Iron deficiency anemia also possibly plan a concern.

## 2017-07-20 ENCOUNTER — Encounter: Payer: Self-pay | Admitting: Family Medicine

## 2017-07-20 ENCOUNTER — Ambulatory Visit (INDEPENDENT_AMBULATORY_CARE_PROVIDER_SITE_OTHER): Payer: Medicaid Other | Admitting: Family Medicine

## 2017-07-20 DIAGNOSIS — S060X0D Concussion without loss of consciousness, subsequent encounter: Secondary | ICD-10-CM | POA: Diagnosis not present

## 2017-07-20 NOTE — Assessment & Plan Note (Signed)
Patient doing significantly better at this time. Patient's release with no significant restrictions. We discussed if worsening symptoms to come back otherwise patient can follow-up as needed. Saturating of the medications.

## 2017-07-20 NOTE — Patient Instructions (Signed)
Good to see you  Fish oil 2 grams daily  Vitamin D 2000 IU daily  Choline 500mg  daily  Otherwise can stop the other medicines if you are feeling better You will do great  If not completely perfect in 2 weeks call me again but you will do great!
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
## 2017-09-15 ENCOUNTER — Emergency Department
Admission: EM | Admit: 2017-09-15 | Discharge: 2017-09-15 | Disposition: A | Discharge status: Home / Self Care | Payer: Medicaid Other | Attending: Emergency Medicine | Admitting: Emergency Medicine

## 2017-09-15 ENCOUNTER — Encounter: Payer: Self-pay | Admitting: Emergency Medicine

## 2017-09-15 ENCOUNTER — Emergency Department: Payer: Medicaid Other

## 2017-09-15 DIAGNOSIS — B9689 Other specified bacterial agents as the cause of diseases classified elsewhere: Secondary | ICD-10-CM | POA: Diagnosis not present

## 2017-09-15 DIAGNOSIS — N760 Acute vaginitis: Secondary | ICD-10-CM | POA: Insufficient documentation

## 2017-09-15 DIAGNOSIS — Z79899 Other long term (current) drug therapy: Secondary | ICD-10-CM | POA: Insufficient documentation

## 2017-09-15 DIAGNOSIS — R1032 Left lower quadrant pain: Secondary | ICD-10-CM | POA: Diagnosis not present

## 2017-09-15 DIAGNOSIS — R102 Pelvic and perineal pain: Secondary | ICD-10-CM | POA: Diagnosis present

## 2017-09-15 LAB — URINALYSIS, COMPLETE (UACMP) WITH MICROSCOPIC
BACTERIA UA: NONE SEEN
Bilirubin Urine: NEGATIVE
Glucose, UA: NEGATIVE mg/dL
Hgb urine dipstick: NEGATIVE
Ketones, ur: NEGATIVE mg/dL
NITRITE: NEGATIVE
PROTEIN: NEGATIVE mg/dL
SPECIFIC GRAVITY, URINE: 1.023 (ref 1.005–1.030)
pH: 6 (ref 5.0–8.0)

## 2017-09-15 LAB — CBC
HEMATOCRIT: 37.8 % (ref 35.0–47.0)
HEMOGLOBIN: 12.4 g/dL (ref 12.0–16.0)
MCH: 25.7 pg — AB (ref 26.0–34.0)
MCHC: 32.8 g/dL (ref 32.0–36.0)
MCV: 78.4 fL — ABNORMAL LOW (ref 80.0–100.0)
Platelets: 321 10*3/uL (ref 150–440)
RBC: 4.82 MIL/uL (ref 3.80–5.20)
RDW: 14.6 % — AB (ref 11.5–14.5)
WBC: 6.8 10*3/uL (ref 3.6–11.0)

## 2017-09-15 LAB — COMPREHENSIVE METABOLIC PANEL
ALBUMIN: 3.8 g/dL (ref 3.5–5.0)
ALK PHOS: 63 U/L (ref 38–126)
ALT: 11 U/L — ABNORMAL LOW (ref 14–54)
ANION GAP: 10 (ref 5–15)
AST: 16 U/L (ref 15–41)
BILIRUBIN TOTAL: 0.5 mg/dL (ref 0.3–1.2)
BUN: 10 mg/dL (ref 6–20)
CO2: 25 mmol/L (ref 22–32)
Calcium: 9.2 mg/dL (ref 8.9–10.3)
Chloride: 103 mmol/L (ref 101–111)
Creatinine, Ser: 0.7 mg/dL (ref 0.44–1.00)
GFR calc Af Amer: >60 mL/min (ref >60)
GFR calc non Af Amer: >60 mL/min (ref >60)
GLUCOSE: 104 mg/dL — AB (ref 65–99)
POTASSIUM: 3.3 mmol/L — AB (ref 3.5–5.1)
SODIUM: 138 mmol/L (ref 135–145)
TOTAL PROTEIN: 8 g/dL (ref 6.5–8.1)

## 2017-09-15 LAB — WET PREP, GENITAL
Sperm: NONE SEEN
TRICH WET PREP: NONE SEEN
YEAST WET PREP: NONE SEEN

## 2017-09-15 LAB — LIPASE, BLOOD: Lipase: 26 U/L (ref 11–51)

## 2017-09-15 LAB — POCT PREGNANCY, URINE: PREG TEST UR: NEGATIVE

## 2017-09-15 MED ORDER — OXYCODONE-ACETAMINOPHEN 5-325 MG PO TABS
ORAL_TABLET | ORAL | Status: AC
  Filled 2017-09-15: qty 2

## 2017-09-15 MED ORDER — METRONIDAZOLE 500 MG PO TABS
500.0000 mg | ORAL_TABLET | Freq: Once | ORAL | Status: AC
  Administered 2017-09-15: 500 mg via ORAL
  Filled 2017-09-15: qty 1

## 2017-09-15 MED ORDER — OXYCODONE-ACETAMINOPHEN 5-325 MG PO TABS: 1.0000 | ORAL_TABLET | Freq: Four times a day (QID) | ORAL | 0 refills | Status: DC | PRN

## 2017-09-15 MED ORDER — ONDANSETRON 4 MG PO TBDP
4.0000 mg | ORAL_TABLET | Freq: Once | ORAL | Status: AC | PRN
  Administered 2017-09-15: 4 mg via ORAL
  Filled 2017-09-15: qty 1

## 2017-09-15 MED ORDER — METRONIDAZOLE 500 MG PO TABS: 500.0000 mg | ORAL_TABLET | Freq: Three times a day (TID) | ORAL | 0 refills | Status: DC

## 2017-09-15 MED ORDER — OXYCODONE-ACETAMINOPHEN 5-325 MG PO TABS
2.0000 | ORAL_TABLET | ORAL | Status: AC
  Administered 2017-09-15: 2 via ORAL

## 2017-09-15 NOTE — ED Notes (Signed)
Pt states that she thinks she has a cyst on her ovaries. She has had pain and nausea since Saturday but got worse today. Family at bedside.

## 2017-09-15 NOTE — ED Provider Notes (Signed)
[facility] Emergency Department Provider Note   ____________________________________________   First MD Initiated Contact with Patient 09/15/17 2108     (approximate)  I have reviewed the triage vital signs and the nursing notes.   HISTORY  Chief Complaint Pelvic Pain    HPI Taylor Stephens is a 36 y.o. female here for evaluation of left lower quadrant and pelvic pain  Patient reports that she has a history of previous complication where she had tubal ligation clamps, loose, resulted in dislodgment and damaged her left fallopian tube about a year ago.  She had laparoscopy then.  She reports that over the last 4-5 days she is again experiencing a slowly worsening pain in the left lower abdomen.  No nausea or vomiting.  No fevers or chills.  No pain on the right side of the abdomen.  She reports a moderate but steadily increasing pain in the left lower abdomen.  She reports it feels as though it is discomfort about the area of the left ovary.     History reviewed. No pertinent past medical history.  Patient Active Problem List   Diagnosis Date Noted  . Concussion 07/07/2017  . Acute pelvic pain, female 11/22/2015  . Foreign body in site in genitourinary tract 11/22/2015    Past Surgical History:  Procedure Laterality Date  . LAPAROSCOPY N/A 11/22/2015   Procedure: LAPAROSCOPY DIAGNOSTIC--removal of foreign body;  Surgeon: Nadara Mustard, MD;  Location: [facility] ORS;  Service: Gynecology;  Laterality: N/A;  . MOLE REMOVAL    . tubal ligastion      Prior to Admission medications   Medication Sig Start Date End Date Taking? Authorizing Provider  cetirizine (ZYRTEC) 10 MG tablet Take 10 mg by mouth daily as needed for allergies.    [provider]  cyclobenzaprine (FLEXERIL) 10 MG tablet Take 1 tablet (10 mg total) by mouth at bedtime as needed for muscle spasms. Do not drive while taking as can cause drowsiness 07/02/17   Renford Dills, NP    diazepam (VALIUM) 5 MG tablet Take 1 tablet (5 mg total) by mouth every 8 (eight) hours as needed for anxiety. 06/26/17 06/26/18  Little, Traci M, PA-C  hydrOXYzine (ATARAX/VISTARIL) 25 MG tablet Take 1 tablet (25 mg total) by mouth 2 (two) times daily as needed for anxiety. 07/02/17   Renford Dills, NP  metroNIDAZOLE (FLAGYL) 500 MG tablet Take 1 tablet (500 mg total) by mouth 3 (three) times daily. 09/15/17   Sharyn Creamer, MD  oxyCODONE-acetaminophen (ROXICET) 5-325 MG tablet Take 1 tablet by mouth every 6 (six) hours as needed for severe pain. 09/15/17   Sharyn Creamer, MD  predniSONE (STERAPRED UNI-PAK 21 TAB) 10 MG (21) TBPK tablet Take 6 tablets on day 1. Take 5 tablets on day 2. Take 4 tablets on day 3. Take 3 tablets on day 4. Take 2 tablets on day 5. Take 1 tablets on day 6. 06/26/17   Little, Traci M, PA-C    Allergies Ibuprofen  No family history on file.  Social History Social History  Substance Use Topics  . Smoking status: Never Smoker  . Smokeless tobacco: Never Used  . Alcohol use Yes     Comment: occ.    Review of Systems Constitutional: No fever/chills Eyes: No visual changes. ENT: No sore throat. Cardiovascular: Denies chest pain. Respiratory: Denies shortness of breath. Gastrointestinal: No abdominal pain but she really reports this pain around her "left ovary".  No nausea, no vomiting.  No  diarrhea.  No constipation. Genitourinary: Negative for dysuria. Musculoskeletal: Negative for back pain. Skin: Negative for rash. Neurological: Negative for headaches, focal weakness or numbness.    ____________________________________________   PHYSICAL EXAM:  VITAL SIGNS: ED Triage Vitals  Enc Vitals Group     BP 09/15/17 1905 109/73     Pulse Rate 09/15/17 1905 82     Resp 09/15/17 1905 20     Temp 09/15/17 1905 98.7 F (37.1 C)     Temp Source 09/15/17 1905 Oral     SpO2 09/15/17 1905 100 %     Weight 09/15/17 1905 180 lb (81.6 kg)     Height 09/15/17 1905 5\' 3"   (1.6 m)     Head Circumference --      Peak Flow --      Pain Score 09/15/17 1904 7     Pain Loc --      Pain Edu? --      Excl. in GC? --     Constitutional: Alert and oriented. Well appearing and in no acute distress. Eyes: Conjunctivae are normal. Head: Atraumatic. Nose: No congestion/rhinnorhea. Mouth/Throat: Mucous membranes are moist. Neck: No stridor.   Cardiovascular: Normal rate, regular rhythm. Grossly normal heart sounds.  Good peripheral circulation. Respiratory: Normal respiratory effort.  No retractions. Lungs CTAB. Gastrointestinal: Soft and nontender throughout the right side with negative Murphy, no pain at McBurney's point.  She reports tenderness in the left lower quadrant with a negative rosving's.  She has mild to moderate tenderness without peritoneal signs in the left lower abdomen. No distention. Genitourinary: Pelvic exam performed and escorted by nurse Amy.  Normal external exam, internal exam normal except does note tenderness in the left adnexa however and some mild white discharge (non purulent). Musculoskeletal: No lower extremity tenderness nor edema. Neurologic:  Normal speech and language. No gross focal neurologic deficits are appreciated.  Skin:  Skin is warm, dry and intact. No rash noted. Psychiatric: Mood and affect are normal. Speech and behavior are normal.  ____________________________________________   LABS (all labs ordered are listed, but only abnormal results are displayed)  Labs Reviewed  WET PREP, GENITAL - Abnormal; Notable for the following:       Result Value   Clue Cells Wet Prep HPF POC PRESENT (*)    WBC, Wet Prep HPF POC RARE (*)    All other components within normal limits  COMPREHENSIVE METABOLIC PANEL - Abnormal; Notable for the following:    Potassium 3.3 (*)    Glucose, Bld 104 (*)    ALT 11 (*)    All other components within normal limits  CBC - Abnormal; Notable for the following:    MCV 78.4 (*)    MCH 25.7 (*)      RDW 14.6 (*)    All other components within normal limits  URINALYSIS, COMPLETE (UACMP) WITH MICROSCOPIC - Abnormal; Notable for the following:    Color, Urine YELLOW (*)    APPearance HAZY (*)    Leukocytes, UA SMALL (*)    Squamous Epithelial / LPF 6-30 (*)    All other components within normal limits  CHLAMYDIA/NGC RT PCR ([facility] ONLY)  LIPASE, BLOOD  POC URINE PREG, ED  POCT PREGNANCY, URINE   ____________________________________________  EKG   ____________________________________________  RADIOLOGY  US Transvaginal Non-ob  Result Date: 09/15/2017 CLINICAL DATA:  Left pelvic pain for the past 4 days, worse today. Previous tubal ligation. Negative urine pregnancy test. EXAM: TRANSABDOMINAL AND TRANSVAGINAL ULTRASOUND OF PELVIS  DOPPLER ULTRASOUND OF OVARIES TECHNIQUE: Both transabdominal and transvaginal ultrasound examinations of the pelvis were performed. Transabdominal technique was performed for global imaging of the pelvis including uterus, ovaries, adnexal regions, and pelvic cul-de-sac. It was necessary to proceed with endovaginal exam following the transabdominal exam to visualize the ovaries and better visualize the uterus. Color and duplex Doppler ultrasound was utilized to evaluate blood flow to the ovaries. COMPARISON:  Pelvic ultrasound dated 10/26/2015 and abdomen and pelvis CT dated 10/26/2015. FINDINGS: Uterus Measurements: 7.9 x 6.5 x 4.9 cm. No fibroids or other mass visualized. Endometrium Thickness: 12.2 mm.  No focal abnormality visualized. Right ovary Measurements: 4.2 x 3.1 x 2.0 cm. Normal, containing a corpus luteum. Left ovary Measurements: 3.3 x 1.7 x 1.1 cm. Normal appearance/no adnexal mass. Pulsed Doppler evaluation of both ovaries demonstrates normal low-resistance arterial and venous waveforms. Other findings Small amount of free peritoneal fluid, within normal limits of physiological fluid. IMPRESSION: Normal examination. Electronically Signed    By: Beckie SaltsSteven  Reid M.D.   On: 09/15/2017 22:45   [place] Pelvis Complete  Result Date: 09/15/2017 CLINICAL DATA:  Left pelvic pain for the past 4 days, worse today. Previous tubal ligation. Negative urine pregnancy test. EXAM: TRANSABDOMINAL AND TRANSVAGINAL ULTRASOUND OF PELVIS DOPPLER ULTRASOUND OF OVARIES TECHNIQUE: Both transabdominal and transvaginal ultrasound examinations of the pelvis were performed. Transabdominal technique was performed for global imaging of the pelvis including uterus, ovaries, adnexal regions, and pelvic cul-de-sac. It was necessary to proceed with endovaginal exam following the transabdominal exam to visualize the ovaries and better visualize the uterus. Color and duplex Doppler ultrasound was utilized to evaluate blood flow to the ovaries. COMPARISON:  Pelvic ultrasound dated 10/26/2015 and abdomen and pelvis CT dated 10/26/2015. FINDINGS: Uterus Measurements: 7.9 x 6.5 x 4.9 cm. No fibroids or other mass visualized. Endometrium Thickness: 12.2 mm.  No focal abnormality visualized. Right ovary Measurements: 4.2 x 3.1 x 2.0 cm. Normal, containing a corpus luteum. Left ovary Measurements: 3.3 x 1.7 x 1.1 cm. Normal appearance/no adnexal mass. Pulsed Doppler evaluation of both ovaries demonstrates normal low-resistance arterial and venous waveforms. Other findings Small amount of free peritoneal fluid, within normal limits of physiological fluid. IMPRESSION: Normal examination. Electronically Signed   By: Beckie SaltsSteven  Reid M.D.   On: 09/15/2017 22:45   [place] Art/ven Flow Abd Pelv Doppler  Result Date: 09/15/2017 CLINICAL DATA:  Left pelvic pain for the past 4 days, worse today. Previous tubal ligation. Negative urine pregnancy test. EXAM: TRANSABDOMINAL AND TRANSVAGINAL ULTRASOUND OF PELVIS DOPPLER ULTRASOUND OF OVARIES TECHNIQUE: Both transabdominal and transvaginal ultrasound examinations of the pelvis were performed. Transabdominal technique was performed for global imaging of the pelvis  including uterus, ovaries, adnexal regions, and pelvic cul-de-sac. It was necessary to proceed with endovaginal exam following the transabdominal exam to visualize the ovaries and better visualize the uterus. Color and duplex Doppler ultrasound was utilized to evaluate blood flow to the ovaries. COMPARISON:  Pelvic ultrasound dated 10/26/2015 and abdomen and pelvis CT dated 10/26/2015. FINDINGS: Uterus Measurements: 7.9 x 6.5 x 4.9 cm. No fibroids or other mass visualized. Endometrium Thickness: 12.2 mm.  No focal abnormality visualized. Right ovary Measurements: 4.2 x 3.1 x 2.0 cm. Normal, containing a corpus luteum. Left ovary Measurements: 3.3 x 1.7 x 1.1 cm. Normal appearance/no adnexal mass. Pulsed Doppler evaluation of both ovaries demonstrates normal low-resistance arterial and venous waveforms. Other findings Small amount of free peritoneal fluid, within normal limits of physiological fluid. IMPRESSION: Normal examination. Electronically Signed   By: Viviann SpareSteven  Azucena Kuba M.D.   On: 09/15/2017 22:45    Reviewed ultrasound, normal ovarian ultrasound ____________________________________________   PROCEDURES  Procedure(s) performed: None  Procedures  Critical Care performed: No  ____________________________________________   INITIAL IMPRESSION / ASSESSMENT AND PLAN / ED COURSE  Pertinent labs & imaging results that were available during my care of the patient were reviewed by me and considered in my medical decision making (see chart for details).  4-5 days of increasing pain in the left lower quadrant.  No systemic symptoms, afebrile with normal white count.  No peritonitis and no signs of right-sided abdominal pain.  No evidence support acute gastrointestinal infection, no evidence of peritonitis, no evidence of appendicitis or gallbladder disease noted.  But very reassuring nonsurgical abdomen.  She does have a history of left lower quadrant problems including primarily ovarian which she  reports in the similar fashion.  She is previously had her clips removed.  On exam seem to have focal discomfort primarily in the left adnexa.  We will evaluate with transvaginal ultrasound for further detail.    ----------------------------------------- 11:02 PM on 09/15/2017 -----------------------------------------  I discussed with the patient and her husband the risks and benefits of abdominal CT scan. The present time there is no clear indication that the patient requires CT, the patient does have an abdominal complaint but exam does not suggest acute surgical abdomen and my suspicion for intra-abdominal infection including appendicitis, cholecystitis, aaa, dissection, ischemia, perforation, pancreatitis, diverticulitis or other acute major intra-abdominal process is quite low. After discussing the risks and benefits including benefits of additional evaluation for diagnoses, ruling out infection/perforation/aaa/etc, but also discussing the risks including low, "well less than 1%," but not 0 risk of inducing cancers due to radiation and potential risks of contrast the patient indicated via our shared medical decision-making that she would not do a CAT scan. Rather if the patient does have worsening symptoms, develops a high fever, develops pain or persistent discomfort in the right upper quadrant or right lower quadrant, or other new concerns arise they will come back to emergency room right away. As the patient's clinician I think this is a very reasonable decision having discussed general risks and benefits of CT, and my clinical suspicion that CT would be of benefit at this time is very low.   We will treat for potential BV as the etiology of her lower left abdominal and pelvic discomfort.  At present she has no other systemic symptoms or GI symptoms.  She appears to be low risk by examination without peritonitis for appendicitis or other acute intra-abdominal/surgical process or infection.  GC  test pending.  Did not have cervical motion tenderness however.  Return precautions and treatment recommendations and follow-up discussed with the patient who is agreeable with the plan.  I will prescribe the patient a narcotic pain medicine due to their condition which I anticipate will cause at least moderate pain short term. I discussed with the patient safe use of narcotic pain medicines, and that they are not to drive, work in dangerous areas, or ever take more than prescribed (no more than 1 pill every 6 hours). We discussed that this is the type of medication that can be  overdosed on and the risks of this type of medicine. Patient is very agreeable to only use as prescribed and to never use more than prescribed. Husband driving her home.  ____________________________________________   FINAL CLINICAL IMPRESSION(S) / ED DIAGNOSES  Final diagnoses:  BV (bacterial vaginosis)  Abdominal pain, left  lower quadrant      NEW MEDICATIONS STARTED DURING THIS VISIT:  New Prescriptions   METRONIDAZOLE (FLAGYL) 500 MG TABLET    Take 1 tablet (500 mg total) by mouth 3 (three) times daily.   OXYCODONE-ACETAMINOPHEN (ROXICET) 5-325 MG TABLET    Take 1 tablet by mouth every 6 (six) hours as needed for severe pain.     Note:  This document was prepared using Dragon voice recognition software and may include unintentional dictation errors.     Sharyn Creamer, MD 09/15/17 [phone]

## 2017-09-15 NOTE — ED Notes (Signed)
Spoke with Dr Lamont Snowballifenbark regarding pt's presenting c/o; no further orders obtained at this time

## 2017-09-15 NOTE — ED Triage Notes (Signed)
Patient to ER for c/o left pelvic pain. States she has h/o ovarian cyst. Also has h/o tubal clamps being dislodged and tearing fallopian tube on left side. Patient had laparoscopy at that time. Patient states she has mild nausea, no vomiting as of yet. No fevers reported. States last period was lighter than normal, but cramping felt the same.

## 2017-09-15 NOTE — ED Notes (Signed)
Pelvic cart in room. 

## 2017-09-15 NOTE — Discharge Instructions (Signed)
You were seen in the emergency room for abdominal pain. It is important that you follow up closely with your primary care doctor in the next couple of days. ° ° °Please return to the emergency room right away if you are to develop a fever, severe nausea, your pain becomes severe or worsens, you are unable to keep food down, begin vomiting any dark or bloody fluid, you develop any dark or bloody stools, feel dehydrated, or other new concerns or symptoms arise. ° ° °

## 2017-09-16 LAB — CHLAMYDIA/NGC RT PCR (ARMC ONLY)
Chlamydia Tr: NOT DETECTED
N GONORRHOEAE: NOT DETECTED

## 2017-11-21 ENCOUNTER — Emergency Department: Payer: Medicaid Other

## 2017-11-21 ENCOUNTER — Emergency Department
Admission: EM | Admit: 2017-11-21 | Discharge: 2017-11-21 | Disposition: A | Discharge status: Home / Self Care | Payer: Medicaid Other | Attending: Emergency Medicine | Admitting: Emergency Medicine

## 2017-11-21 ENCOUNTER — Other Ambulatory Visit: Payer: Self-pay

## 2017-11-21 DIAGNOSIS — R05 Cough: Secondary | ICD-10-CM | POA: Diagnosis present

## 2017-11-21 DIAGNOSIS — E860 Dehydration: Secondary | ICD-10-CM | POA: Insufficient documentation

## 2017-11-21 DIAGNOSIS — Z79899 Other long term (current) drug therapy: Secondary | ICD-10-CM | POA: Insufficient documentation

## 2017-11-21 DIAGNOSIS — J40 Bronchitis, not specified as acute or chronic: Secondary | ICD-10-CM | POA: Diagnosis not present

## 2017-11-21 MED ORDER — IPRATROPIUM-ALBUTEROL 0.5-2.5 (3) MG/3ML IN SOLN
3.0000 mL | Freq: Once | RESPIRATORY_TRACT | Status: AC
  Administered 2017-11-21: 3 mL via RESPIRATORY_TRACT
  Filled 2017-11-21: qty 3

## 2017-11-21 MED ORDER — BENZONATATE 100 MG PO CAPS: 100.0000 mg | ORAL_CAPSULE | Freq: Three times a day (TID) | ORAL | 0 refills | Status: AC | PRN

## 2017-11-21 MED ORDER — ONDANSETRON HCL 4 MG PO TABS: 4.0000 mg | ORAL_TABLET | Freq: Every day | ORAL | 0 refills | Status: AC | PRN

## 2017-11-21 MED ORDER — ALBUTEROL SULFATE HFA 108 (90 BASE) MCG/ACT IN AERS: 2.0000 | INHALATION_SPRAY | Freq: Four times a day (QID) | RESPIRATORY_TRACT | 0 refills | Status: DC | PRN

## 2017-11-21 MED ORDER — AZITHROMYCIN 250 MG PO TABS: ORAL_TABLET | ORAL | 0 refills | Status: DC

## 2017-11-21 MED ORDER — SODIUM CHLORIDE 0.9 % IV BOLUS (SEPSIS)
1000.0000 mL | Freq: Once | INTRAVENOUS | Status: AC
  Administered 2017-11-21: 1000 mL via INTRAVENOUS

## 2017-11-21 MED ORDER — ONDANSETRON HCL 4 MG/2ML IJ SOLN
4.0000 mg | Freq: Once | INTRAMUSCULAR | Status: AC
  Administered 2017-11-21: 4 mg via INTRAVENOUS
  Filled 2017-11-21: qty 2

## 2017-11-21 NOTE — ED Triage Notes (Addendum)
Pt arrives to ED via POV from home with c/o cough x2 weeks and 4 episodes of post-tussive emesis x24hrs. Pt reports cough is productive with yellow-colored sputum. Pt denies diarrhea, fever,  CP or abdominal pain. Pt is A&O, in NAD; RR even, regular, and unlabored.

## 2017-11-21 NOTE — ED Provider Notes (Signed)
[facility] Emergency Department Provider Note  ____________________________________________  Time seen: Approximately 3:37 PM  I have reviewed the triage vital signs and the nursing notes.   HISTORY  Chief Complaint Cough and Emesis    HPI Taylor Stephens is a 36 y.o. female that presents to emergency department for evaluation of cough for 2 weeks and vomiting for 1 day.  Cough is nonproductive.  She has vomited 4 times since yesterday.  Sometimes it happens after coughing and sometimes it does not.  Patient has been drinking ginger ale but unable to tolerate water.  She states that when she stands she is feeling a bit dizzy.  She is concerned that she is dehydrated.  She does not smoke.  She has seasonal allergies.  No asthma.  No fever, chills, abdominal pain, diarrhea, constipation.  History reviewed. No pertinent past medical history.  Patient Active Problem List   Diagnosis Date Noted  . Concussion 07/07/2017  . Acute pelvic pain, female 11/22/2015  . Foreign body in site in genitourinary tract 11/22/2015    Past Surgical History:  Procedure Laterality Date  . LAPAROSCOPY N/A 11/22/2015   Procedure: LAPAROSCOPY DIAGNOSTIC--removal of foreign body;  Surgeon: Nadara Mustardobert P Harris, MD;  Location: [facility] ORS;  Service: Gynecology;  Laterality: N/A;  . MOLE REMOVAL    . tubal ligastion      Prior to Admission medications   Medication Sig Start Date End Date Taking? Authorizing Provider  albuterol (PROVENTIL HFA;VENTOLIN HFA) 108 (90 Base) MCG/ACT inhaler Inhale 2 puffs into the lungs every 6 (six) hours as needed for wheezing or shortness of breath. 11/21/17   Enid DerryWagner, Bedie Dominey, PA-C  azithromycin (ZITHROMAX Z-PAK) 250 MG tablet Take 2 tablets (500 mg) on  Day 1,  followed by 1 tablet (250 mg) once daily on Days 2 through 5. 11/21/17   Enid DerryWagner, Angi Goodell, PA-C  benzonatate (TESSALON PERLES) 100 MG capsule Take 1 capsule (100 mg total) by mouth 3 (three) times daily  as needed for cough. 11/21/17 11/21/18  Enid DerryWagner, Diyari Cherne, PA-C  cetirizine (ZYRTEC) 10 MG tablet Take 10 mg by mouth daily as needed for allergies.    [provider]  cyclobenzaprine (FLEXERIL) 10 MG tablet Take 1 tablet (10 mg total) by mouth at bedtime as needed for muscle spasms. Do not drive while taking as can cause drowsiness 07/02/17   Renford DillsMiller, Lindsey, NP  diazepam (VALIUM) 5 MG tablet Take 1 tablet (5 mg total) by mouth every 8 (eight) hours as needed for anxiety. 06/26/17 06/26/18  Little, Traci M, PA-C  hydrOXYzine (ATARAX/VISTARIL) 25 MG tablet Take 1 tablet (25 mg total) by mouth 2 (two) times daily as needed for anxiety. 07/02/17   Renford DillsMiller, Lindsey, NP  metroNIDAZOLE (FLAGYL) 500 MG tablet Take 1 tablet (500 mg total) by mouth 3 (three) times daily. 09/15/17   Sharyn CreamerQuale, Mark, MD  ondansetron (ZOFRAN) 4 MG tablet Take 1 tablet (4 mg total) by mouth daily as needed for nausea or vomiting. 11/21/17 11/21/18  Enid DerryWagner, Tyrene Nader, PA-C  oxyCODONE-acetaminophen (ROXICET) 5-325 MG tablet Take 1 tablet by mouth every 6 (six) hours as needed for severe pain. 09/15/17   Sharyn CreamerQuale, Mark, MD  predniSONE (STERAPRED UNI-PAK 21 TAB) 10 MG (21) TBPK tablet Take 6 tablets on day 1. Take 5 tablets on day 2. Take 4 tablets on day 3. Take 3 tablets on day 4. Take 2 tablets on day 5. Take 1 tablets on day 6. 06/26/17   Little, Jordan Likesraci M, PA-C  Allergies Ibuprofen  No family history on file.  Social History Social History   Tobacco Use  . Smoking status: Never Smoker  . Smokeless tobacco: Never Used  Substance Use Topics  . Alcohol use: Yes    Comment: occ.  . Drug use: Not on file     Review of Systems  Constitutional: No fever/chills Eyes: No visual changes. No discharge. ENT: Positive for congestion and rhinorrhea. Cardiovascular: No chest pain. Respiratory: Positive for cough. No SOB. Gastrointestinal: No abdominal pain.  No diarrhea.  No constipation. Musculoskeletal: Negative for musculoskeletal  pain. Skin: Negative for rash, abrasions, lacerations, ecchymosis. Neurological: Negative for headaches.   ____________________________________________   PHYSICAL EXAM:  VITAL SIGNS: ED Triage Vitals  Enc Vitals Group     BP 11/21/17 1418 117/76     Pulse Rate 11/21/17 1418 84     Resp 11/21/17 1418 20     Temp 11/21/17 1418 99.2 F (37.3 C)     Temp Source 11/21/17 1418 Oral     SpO2 11/21/17 1418 99 %     Weight 11/21/17 1415 180 lb (81.6 kg)     Height 11/21/17 1415 5\' 3"  (1.6 m)     Head Circumference --      Peak Flow --      Pain Score 11/21/17 1415 3     Pain Loc --      Pain Edu? --      Excl. in GC? --      Constitutional: Alert and oriented. Well appearing and in no acute distress. Eyes: Conjunctivae are normal. PERRL. EOMI. No discharge. Head: Atraumatic. ENT: No frontal and maxillary sinus tenderness.      Ears: Tympanic membranes pearly gray with good landmarks. No discharge.      Nose: Mild congestion/rhinnorhea.      Mouth/Throat: Mucous membranes are moist. Oropharynx non-erythematous. Tonsils not enlarged. No exudates. Uvula midline. Neck: No stridor.   Hematological/Lymphatic/Immunilogical: No cervical lymphadenopathy. Cardiovascular: Normal rate, regular rhythm.  Good peripheral circulation. Respiratory: Normal respiratory effort without tachypnea or retractions. Lungs CTAB. Good air entry to the bases with no decreased or absent breath sounds. Gastrointestinal: Bowel sounds 4 quadrants. Soft and nontender to palpation. No guarding or rigidity. No palpable masses. No distention. Musculoskeletal: Full range of motion to all extremities. No gross deformities appreciated. Neurologic:  Normal speech and language. No gross focal neurologic deficits are appreciated.  Skin:  Skin is warm, dry and intact. No rash noted.   ____________________________________________   LABS (all labs ordered are listed, but only abnormal results are displayed)  Labs  Reviewed - No data to display ____________________________________________  EKG   ____________________________________________  RADIOLOGY Lexine BatonI, Nora Sabey, personally viewed and evaluated these images (plain radiographs) as part of my medical decision making, as well as reviewing the written report by the radiologist.  Dg Chest 2 View  Result Date: 11/21/2017 CLINICAL DATA:  Productive cough for 2 weeks. Fever. EXAM: CHEST  2 VIEW COMPARISON:  None. FINDINGS: Normal heart size. Normal mediastinal contour. No pneumothorax. No pleural effusion. Lungs appear clear, with no acute consolidative airspace disease and no pulmonary edema. IMPRESSION: No active cardiopulmonary disease. Electronically Signed   By: Delbert PhenixJason A Poff M.D.   On: 11/21/2017 15:02    ____________________________________________    PROCEDURES  Procedure(s) performed:    Procedures    Medications  sodium chloride 0.9 % bolus 1,000 mL (0 mLs Intravenous Stopped 11/21/17 1717)  ondansetron (ZOFRAN) injection 4 mg (4 mg Intravenous Given 11/21/17  1616)  ipratropium-albuterol (DUONEB) 0.5-2.5 (3) MG/3ML nebulizer solution 3 mL (3 mLs Nebulization Given 11/21/17 1616)  ipratropium-albuterol (DUONEB) 0.5-2.5 (3) MG/3ML nebulizer solution 3 mL (3 mLs Nebulization Given 11/21/17 1717)     ____________________________________________   INITIAL IMPRESSION / ASSESSMENT AND PLAN / ED COURSE  Pertinent labs & imaging results that were available during my care of the patient were reviewed by me and considered in my medical decision making (see chart for details).  Review of the [place] CSRS was performed in accordance of the [facility] prior to dispensing any controlled drugs.   Patient's diagnosis is consistent with bronchitis and dehydration. Vital signs and exam are reassuring.  Chest x-ray negative for acute processes.  Dizziness resolved after IV fluids.  Patient was given Zofran and able to tolerate liquids after medication.   Cough improved after DuoNeb treatments.  Patient feels comfortable going home. Patient will be discharged home with prescriptions for azithromycin, albuterol inhaler, Tessalon Perles, Zofran. Patient is to follow up with PCP as needed or otherwise directed. Patient is given ED precautions to return to the ED for any worsening or new symptoms.     ____________________________________________  FINAL CLINICAL IMPRESSION(S) / ED DIAGNOSES  Final diagnoses:  Bronchitis  Dehydration      NEW MEDICATIONS STARTED DURING THIS VISIT:  ED Discharge Orders        Ordered    azithromycin (ZITHROMAX Z-PAK) 250 MG tablet     11/21/17 1754    benzonatate (TESSALON PERLES) 100 MG capsule  3 times daily PRN     11/21/17 1754    albuterol (PROVENTIL HFA;VENTOLIN HFA) 108 (90 Base) MCG/ACT inhaler  Every 6 hours PRN     11/21/17 1754    ondansetron (ZOFRAN) 4 MG tablet  Daily PRN     11/21/17 1754          This chart was dictated using voice recognition software/Dragon. Despite best efforts to proofread, errors can occur which can change the meaning. Any change was purely unintentional.    Enid Derry, PA-C 11/21/17 1901    Jeanmarie Plant, MD 11/21/17 [phone]

## 2017-11-30 IMAGING — US US TRANSVAGINAL NON-OB
1 series · 13 of 25 positions shown · non-contrast
Comparison: 04/23/2015

CLINICAL DATA: Left pelvic pain

EXAM:
TRANSABDOMINAL AND TRANSVAGINAL ULTRASOUND OF PELVIS
DOPPLER ULTRASOUND OF OVARIES
TECHNIQUE: Both transabdominal and transvaginal ultrasound examinations of the
pelvis were performed. Transabdominal technique was performed for
global imaging of the pelvis including uterus, ovaries, adnexal
regions, and pelvic cul-de-sac.
It was necessary to proceed with endovaginal exam following the
transabdominal exam to visualize the endometrium. Color and duplex
Doppler ultrasound was utilized to evaluate blood flow to the
ovaries.

[Series 1: us transvaginal non-ob · 0.21mm/px · 13 of 198 slices shown]
[im 1/198]
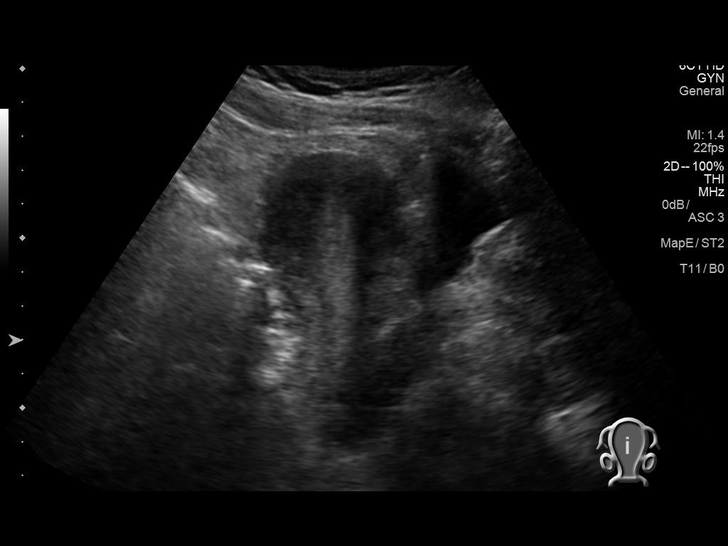
[im 17/198]
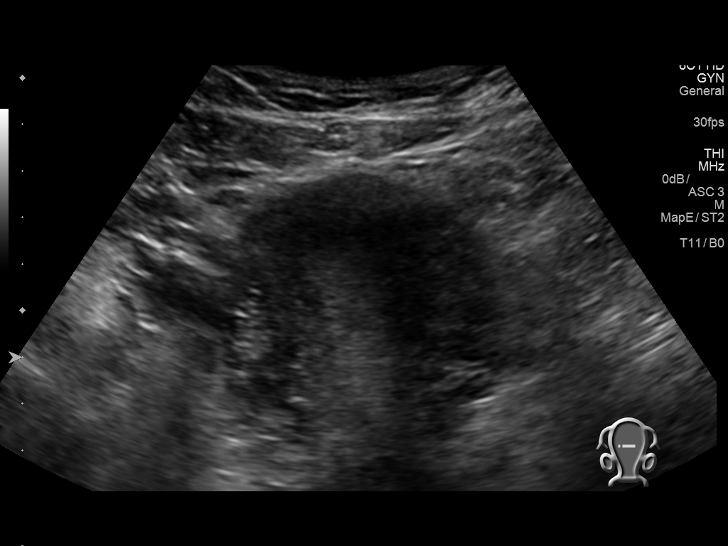
[im 33/198]
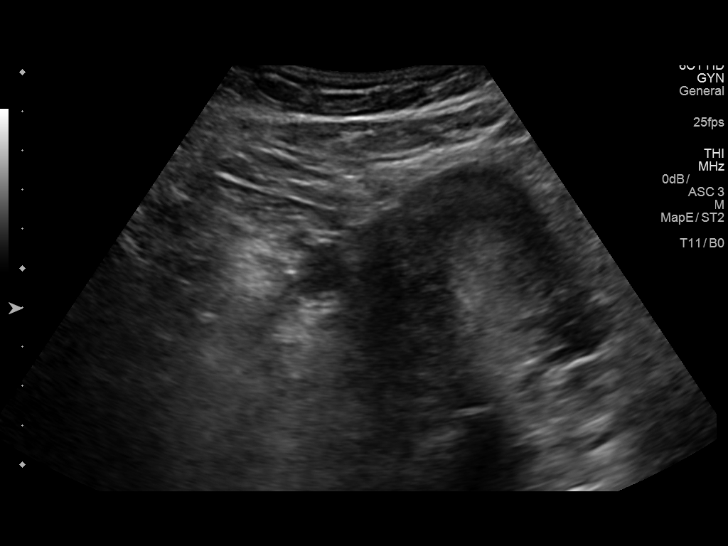
[im 50/198]
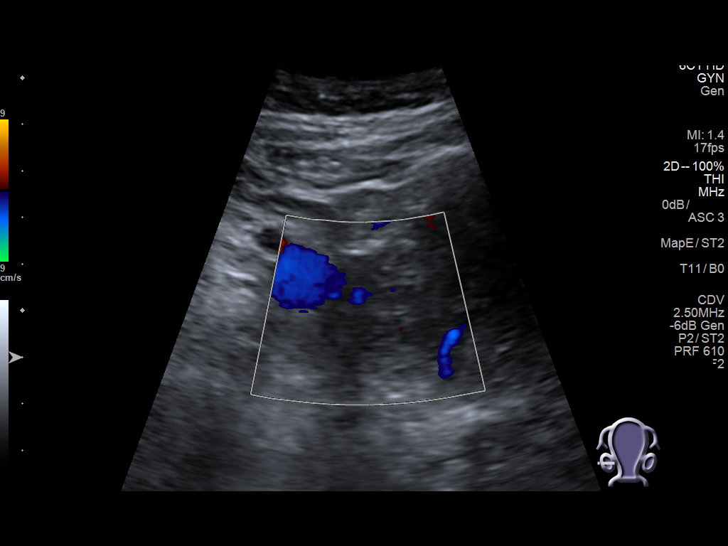
[im 66/198]
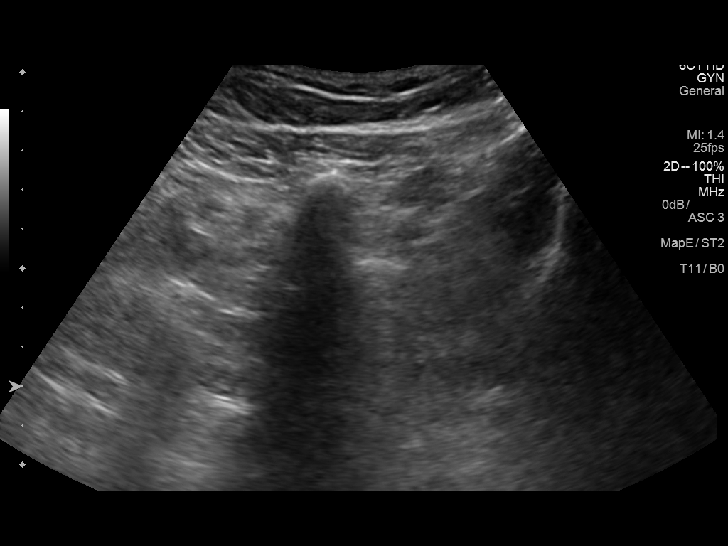
[im 83/198]
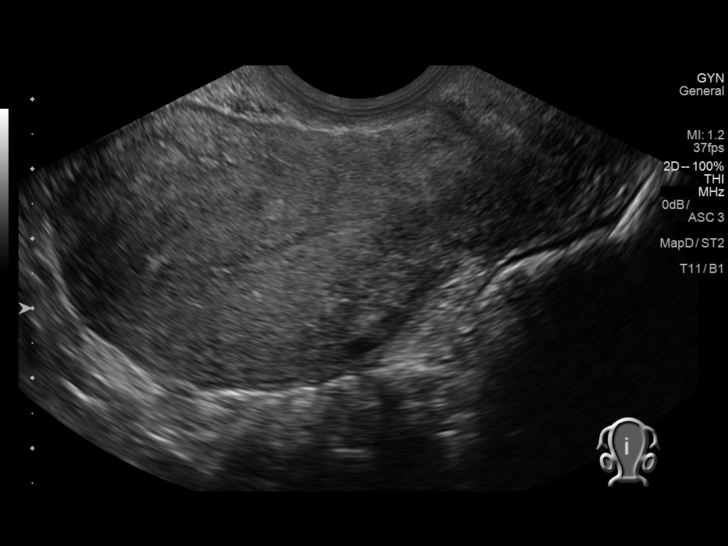
[im 99/198]
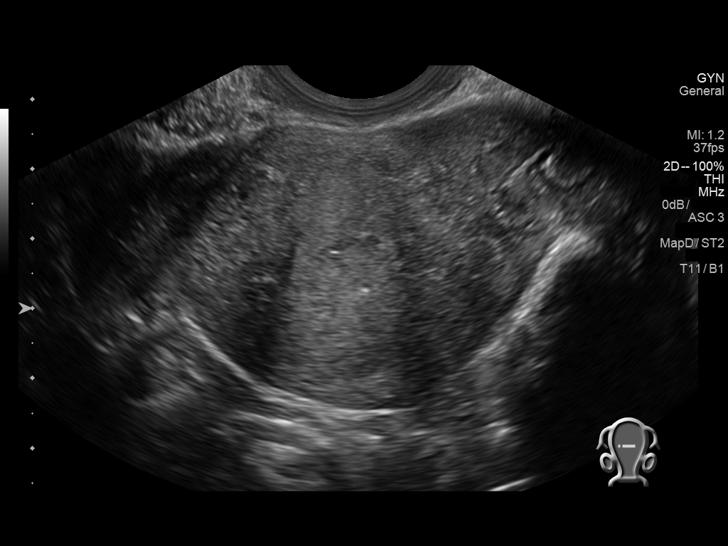
[im 115/198]
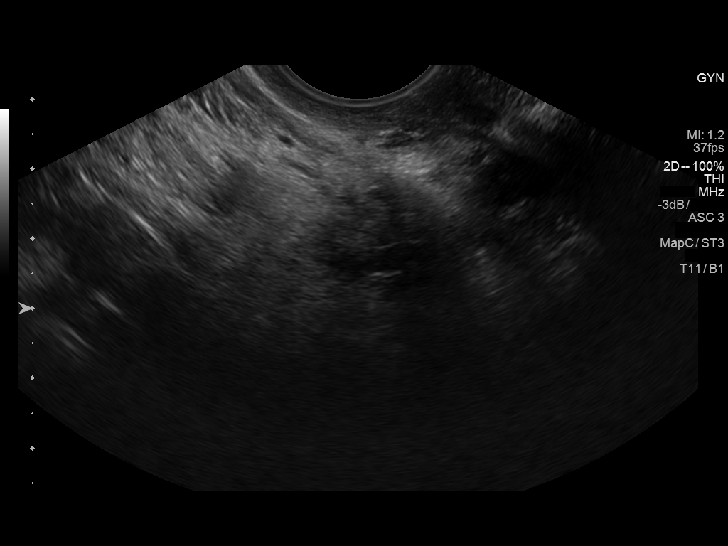
[im 132/198]
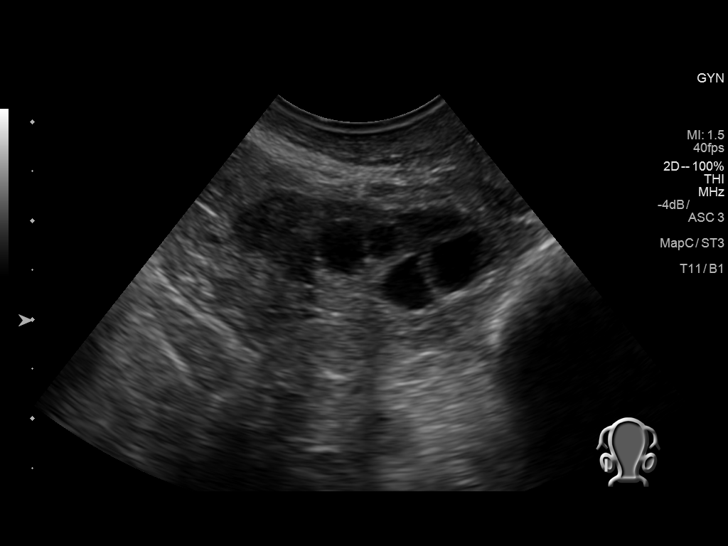
[im 148/198]
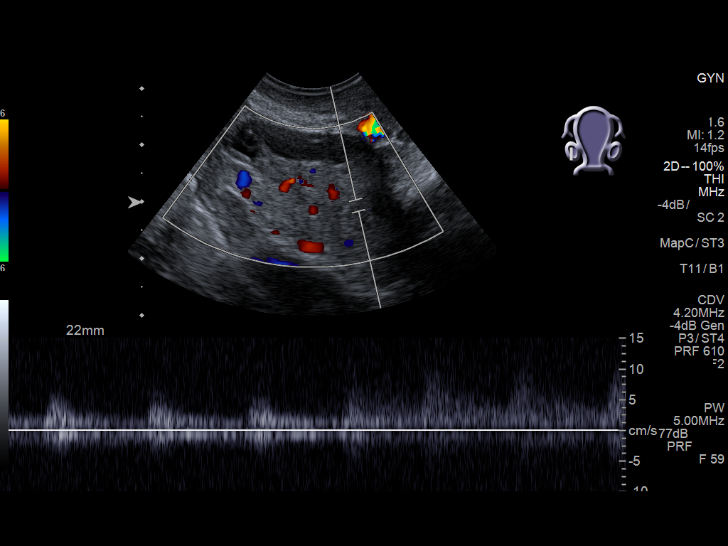
[im 165/198]
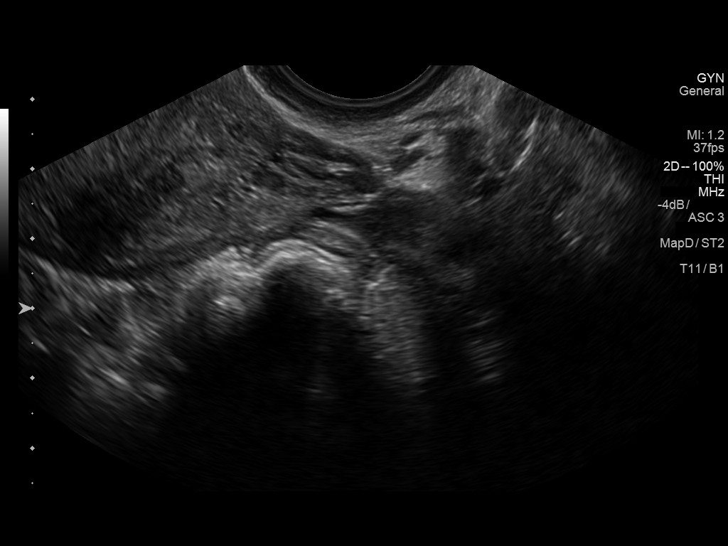
[im 181/198]
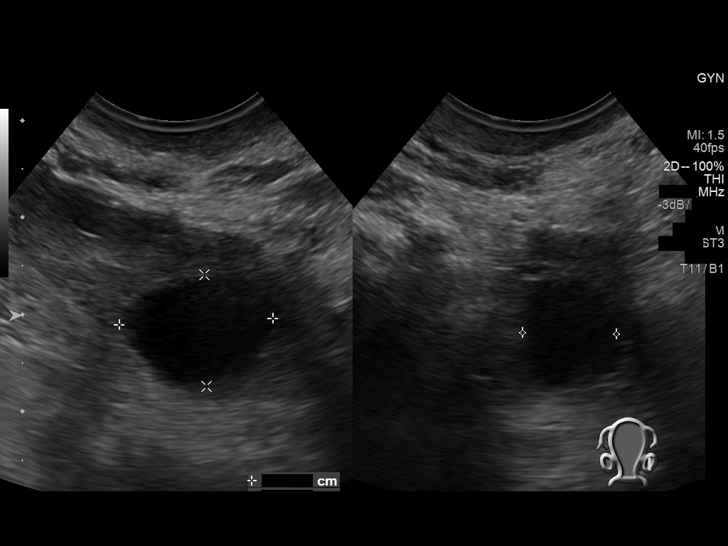
[im 198/198]
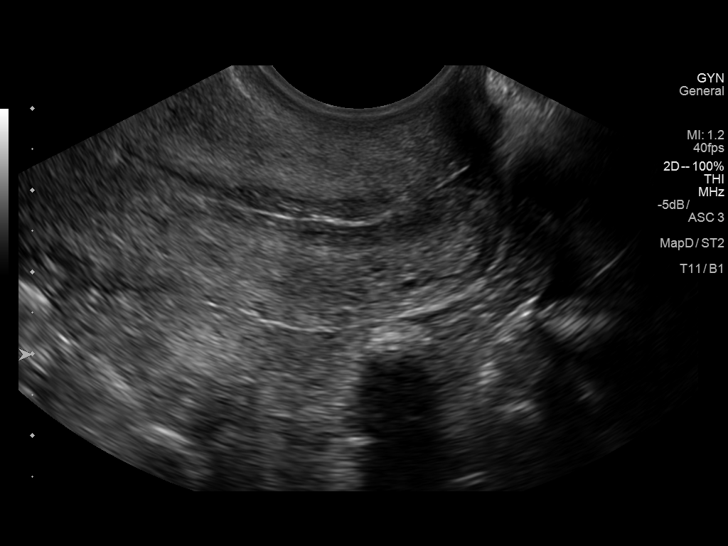

[13 of 25 positions shown; findings below may reference images not displayed]

FINDINGS: Uterus

Measurements: 9 x 4 x 5 cm. No fibroids or other mass visualized.

Endometrium

Thickness: 4 mm. No focal abnormality visualized (apparent focal
echogenic thickening inferiorly and posteriorly seen on image 114 is
considered artifactual as no lesion visualized on transverse images
or on sequential longitudinal scan -images 90-100).

Right ovary

Measurements: 29 x 17 x 17 mm. Normal appearance/no adnexal mass.

Left ovary

Measurements: 26 x 15 x 13 mm. Resolved hemorrhagic cyst seen
previously. Normal appearance/no adnexal mass.

Pulsed Doppler evaluation of both ovaries demonstrates normal
low-resistance arterial and venous waveforms.

Other findings

No free fluid.
IMPRESSION: Negative.  No explanation for left adnexal pain.

## 2018-02-26 ENCOUNTER — Telehealth: Payer: Self-pay | Admitting: *Deleted

## 2018-02-26 NOTE — Telephone Encounter (Signed)
This patient was referred back in Nov 2018. They need to get info about what was done at her visit. May call them at [phone] or fax records to [phone]

## 2018-06-24 ENCOUNTER — Other Ambulatory Visit: Payer: Self-pay

## 2018-06-24 ENCOUNTER — Encounter: Payer: Self-pay | Admitting: Emergency Medicine

## 2018-06-24 ENCOUNTER — Emergency Department: Payer: Medicaid Other

## 2018-06-24 ENCOUNTER — Emergency Department
Admission: EM | Admit: 2018-06-24 | Discharge: 2018-06-24 | Disposition: A | Discharge status: Home / Self Care | Payer: Medicaid Other | Attending: Emergency Medicine | Admitting: Emergency Medicine

## 2018-06-24 DIAGNOSIS — N83201 Unspecified ovarian cyst, right side: Secondary | ICD-10-CM | POA: Diagnosis not present

## 2018-06-24 DIAGNOSIS — N939 Abnormal uterine and vaginal bleeding, unspecified: Secondary | ICD-10-CM | POA: Diagnosis not present

## 2018-06-24 DIAGNOSIS — N83209 Unspecified ovarian cyst, unspecified side: Secondary | ICD-10-CM

## 2018-06-24 DIAGNOSIS — R102 Pelvic and perineal pain: Secondary | ICD-10-CM | POA: Insufficient documentation

## 2018-06-24 LAB — URINALYSIS, ROUTINE W REFLEX MICROSCOPIC
Bacteria, UA: NONE SEEN
Bilirubin Urine: NEGATIVE
Glucose, UA: NEGATIVE mg/dL
KETONES UR: NEGATIVE mg/dL
Leukocytes, UA: NEGATIVE
Nitrite: NEGATIVE
PH: 7 (ref 5.0–8.0)
PROTEIN: NEGATIVE mg/dL
Specific Gravity, Urine: 1.019 (ref 1.005–1.030)

## 2018-06-24 LAB — CBC WITH DIFFERENTIAL/PLATELET
BASOS ABS: 0 10*3/uL (ref 0–0.1)
BASOS PCT: 1 %
Eosinophils Absolute: 0.2 10*3/uL (ref 0–0.7)
Eosinophils Relative: 3 %
HCT: 35 % (ref 35.0–47.0)
HEMOGLOBIN: 11.7 g/dL — AB (ref 12.0–16.0)
LYMPHS PCT: 34 %
Lymphs Abs: 2 10*3/uL (ref 1.0–3.6)
MCH: 26.4 pg (ref 26.0–34.0)
MCHC: 33.6 g/dL (ref 32.0–36.0)
MCV: 78.6 fL — AB (ref 80.0–100.0)
MONO ABS: 0.4 10*3/uL (ref 0.2–0.9)
Monocytes Relative: 7 %
NEUTROS ABS: 3.3 10*3/uL (ref 1.4–6.5)
NEUTROS PCT: 55 %
PLATELETS: 324 10*3/uL (ref 150–440)
RBC: 4.45 MIL/uL (ref 3.80–5.20)
RDW: 14.8 % — ABNORMAL HIGH (ref 11.5–14.5)
WBC: 5.9 10*3/uL (ref 3.6–11.0)

## 2018-06-24 LAB — BASIC METABOLIC PANEL
ANION GAP: 7 (ref 5–15)
BUN: 10 mg/dL (ref 6–20)
CALCIUM: 8.9 mg/dL (ref 8.9–10.3)
CO2: 26 mmol/L (ref 22–32)
Chloride: 106 mmol/L (ref 98–111)
Creatinine, Ser: 0.77 mg/dL (ref 0.44–1.00)
GFR calc non Af Amer: >60 mL/min (ref >60)
Glucose, Bld: 98 mg/dL (ref 70–99)
Potassium: 4 mmol/L (ref 3.5–5.1)
SODIUM: 139 mmol/L (ref 135–145)

## 2018-06-24 LAB — PROTIME-INR
INR: 1.08
PROTHROMBIN TIME: 13.9 s (ref 11.4–15.2)

## 2018-06-24 LAB — WET PREP, GENITAL
Clue Cells Wet Prep HPF POC: NONE SEEN
Sperm: NONE SEEN
Trich, Wet Prep: NONE SEEN
YEAST WET PREP: NONE SEEN

## 2018-06-24 LAB — HCG, QUANTITATIVE, PREGNANCY: hCG, Beta Chain, Quant, S: <1 m[IU]/mL (ref <5)

## 2018-06-24 MED ORDER — OXYCODONE-ACETAMINOPHEN 5-325 MG PO TABS
1.0000 | ORAL_TABLET | Freq: Once | ORAL | Status: AC
  Administered 2018-06-24: 1 via ORAL
  Filled 2018-06-24: qty 1

## 2018-06-24 MED ORDER — HYDROCODONE-ACETAMINOPHEN 5-325 MG PO TABS: 1.0000 | ORAL_TABLET | Freq: Four times a day (QID) | ORAL | 0 refills | Status: AC | PRN

## 2018-06-24 MED ORDER — ONDANSETRON HCL 4 MG/2ML IJ SOLN
4.0000 mg | Freq: Once | INTRAMUSCULAR | Status: AC
  Administered 2018-06-24: 4 mg via INTRAVENOUS
  Filled 2018-06-24: qty 2

## 2018-06-24 NOTE — ED Triage Notes (Signed)
Patient to ER for c/o lower abd pain, nausea, and vaginal bleeding. Patient states she developed vaginal bleeding upon waking approx one hour ago. States bleeding is somewhat light and brown in color. States this is not normal time for her period.

## 2018-06-24 NOTE — Discharge Instructions (Addendum)
You have an ovarian cyst on the right side.  It is possible he also had one on the left that ruptured.  You can take the medication prescribed for pain, although you should switch to over-the-counter medicine such as Tylenol as soon as you are able to.  Make an appointment to follow-up with your OB/GYN in the next 1 to 2 weeks.  You will need a repeat ultrasound in 6 to 12 weeks to check on the cyst seen today.  Return to the ER for new, worsening, persistent severe pelvic or abdominal pain, vomiting, fevers, weakness, severe vaginal bleeding, or any other new or worsening symptoms that concern you.

## 2018-06-24 NOTE — ED Notes (Signed)
Patient transported to Ultrasound 

## 2018-06-24 NOTE — ED Provider Notes (Signed)
[facility] Emergency Department Provider Note ____________________________________________   First MD Initiated Contact with Patient 06/24/18 [phone]     (approximate)  I have reviewed the triage vital signs and the nursing notes.   HISTORY  Chief Complaint Vaginal Bleeding and Abdominal Pain    HPI Taylor Stephens is a 37 y.o. female with PMH as noted below who presents with pelvic pain, acute onset approximately 2 hours ago, now somewhat improved, mainly in the left side of her pelvis and lower abdomen, and described as crampy in nature.  She reported some spotting this morning as well, but denies other acute symptoms.  She states that she also had some vaginal discharge a few days ago and took Monistat, and it improved.  She reports prior history of ovarian cysts but has not had to have them removed.  She denies urinary symptoms or fever.   History reviewed. No pertinent past medical history.  Patient Active Problem List   Diagnosis Date Noted  . Concussion 07/07/2017  . Acute pelvic pain, female 11/22/2015  . Foreign body in site in genitourinary tract 11/22/2015    Past Surgical History:  Procedure Laterality Date  . LAPAROSCOPY N/A 11/22/2015   Procedure: LAPAROSCOPY DIAGNOSTIC--removal of foreign body;  Surgeon: Nadara Mustard, MD;  Location: [facility] ORS;  Service: Gynecology;  Laterality: N/A;  . MOLE REMOVAL    . tubal ligastion      Prior to Admission medications   Medication Sig Start Date End Date Taking? Authorizing Provider  albuterol (PROVENTIL HFA;VENTOLIN HFA) 108 (90 Base) MCG/ACT inhaler Inhale 2 puffs into the lungs every 6 (six) hours as needed for wheezing or shortness of breath. 11/21/17   Enid Derry, PA-C  azithromycin (ZITHROMAX Z-PAK) 250 MG tablet Take 2 tablets (500 mg) on  Day 1,  followed by 1 tablet (250 mg) once daily on Days 2 through 5. 11/21/17   Enid Derry, PA-C  benzonatate (TESSALON PERLES) 100 MG capsule  Take 1 capsule (100 mg total) by mouth 3 (three) times daily as needed for cough. 11/21/17 11/21/18  Enid Derry, PA-C  cetirizine (ZYRTEC) 10 MG tablet Take 10 mg by mouth daily as needed for allergies.    [provider]  cyclobenzaprine (FLEXERIL) 10 MG tablet Take 1 tablet (10 mg total) by mouth at bedtime as needed for muscle spasms. Do not drive while taking as can cause drowsiness 07/02/17   Renford Dills, NP  diazepam (VALIUM) 5 MG tablet Take 1 tablet (5 mg total) by mouth every 8 (eight) hours as needed for anxiety. 06/26/17 06/26/18  Little, Traci M, PA-C  hydrOXYzine (ATARAX/VISTARIL) 25 MG tablet Take 1 tablet (25 mg total) by mouth 2 (two) times daily as needed for anxiety. 07/02/17   Renford Dills, NP  metroNIDAZOLE (FLAGYL) 500 MG tablet Take 1 tablet (500 mg total) by mouth 3 (three) times daily. 09/15/17   Sharyn Creamer, MD  ondansetron (ZOFRAN) 4 MG tablet Take 1 tablet (4 mg total) by mouth daily as needed for nausea or vomiting. 11/21/17 11/21/18  Enid Derry, PA-C  oxyCODONE-acetaminophen (ROXICET) 5-325 MG tablet Take 1 tablet by mouth every 6 (six) hours as needed for severe pain. 09/15/17   Sharyn Creamer, MD  predniSONE (STERAPRED UNI-PAK 21 TAB) 10 MG (21) TBPK tablet Take 6 tablets on day 1. Take 5 tablets on day 2. Take 4 tablets on day 3. Take 3 tablets on day 4. Take 2 tablets on day 5. Take 1 tablets on day  6. 06/26/17   Little, Traci M, PA-C    Allergies Ibuprofen  No family history on file.  Social History Social History   Tobacco Use  . Smoking status: Never Smoker  . Smokeless tobacco: Never Used  Substance Use Topics  . Alcohol use: Yes    Comment: occ.  . Drug use: Not on file    Review of Systems  Constitutional: No fever. Eyes: No redness. ENT: No sore throat. Cardiovascular: Denies chest pain. Respiratory: Denies shortness of breath. Gastrointestinal: Positive for nausea, no vomiting. Genitourinary: Negative for dysuria.  Positive for  vaginal bleeding. Musculoskeletal: Negative for back pain. Skin: Negative for rash. Neurological: Negative for headache.   ____________________________________________   PHYSICAL EXAM:  VITAL SIGNS: ED Triage Vitals  Enc Vitals Group     BP 06/24/18 0632 125/87     Pulse Rate 06/24/18 0632 76     Resp 06/24/18 0632 20     Temp 06/24/18 0632 98.2 F (36.8 C)     Temp Source 06/24/18 0632 Oral     SpO2 06/24/18 0632 96 %     Weight 06/24/18 0627 180 lb (81.6 kg)     Height 06/24/18 0627 5\' 3"  (1.6 m)     Head Circumference --      Peak Flow --      Pain Score 06/24/18 0626 8     Pain Loc --      Pain Edu? --      Excl. in GC? --     Constitutional: Alert and oriented. Well appearing and in no acute distress. Eyes: Conjunctivae are normal.  Head: Atraumatic. Nose: No congestion/rhinnorhea. Mouth/Throat: Mucous membranes are moist.   Neck: Normal range of motion.  Cardiovascular: Good peripheral circulation. Respiratory: Normal respiratory effort.  Gastrointestinal: Soft with mild left suprapubic tenderness. No distention.  Genitourinary: Small amount of blood, with no active hemorrhage.  No significant discharge, adnexal tenderness, or CMT. Musculoskeletal: Extremities warm and well perfused.  Neurologic:  Normal speech and language. No gross focal neurologic deficits are appreciated.  Skin:  Skin is warm and dry. No rash noted. Psychiatric: Mood and affect are normal. Speech and behavior are normal.  ____________________________________________   LABS (all labs ordered are listed, but only abnormal results are displayed)  Labs Reviewed  CBC WITH DIFFERENTIAL/PLATELET - Abnormal; Notable for the following components:      Result Value   Hemoglobin 11.7 (*)    MCV 78.6 (*)    RDW 14.8 (*)    All other components within normal limits  WET PREP, GENITAL  HCG, QUANTITATIVE, PREGNANCY  BASIC METABOLIC PANEL  PROTIME-INR  URINALYSIS, ROUTINE W REFLEX MICROSCOPIC    POC URINE PREG, ED   ____________________________________________  EKG   ____________________________________________  RADIOLOGY  US pelvis: 3 cm right ovarian cyst, and small amount of pelvic free fluid  ____________________________________________   PROCEDURES  Procedure(s) performed: No  Procedures  Critical Care performed: No ____________________________________________   INITIAL IMPRESSION / ASSESSMENT AND PLAN / ED COURSE  Pertinent labs & imaging results that were available during my care of the patient were reviewed by me and considered in my medical decision making (see chart for details).  37 year old female with PMH as noted above presents with acute onset of left-sided pelvic pain, associated with some vaginal spotting this morning.  She reports that the pain is somewhat subsiding.  On exam, she is relatively well-appearing and has left suprapubic tenderness.  There is mild tenderness on pelvic exam.  Differential includes primarily ovarian cyst, mittelschmerz, or DUB.  Given the patient's overall well appearance, the exam findings, and the subsiding pain, I have a very low suspicion for ovarian torsion.  We will obtain a pelvic ultrasound, basic labs, wet prep, and reassess.  ----------------------------------------- 11:21 AM on 06/24/2018 -----------------------------------------  The patient's pain is significantly improved.  The lab work-up is unremarkable, and the UA and wet prep showed no significant acute findings.  Ultrasound shows a 3 cm right ovarian cyst, and the patient's hCG is negative so this is not an ectopic.  She also has a small amount of pelvic free fluid.  I suspect that the patient may have had a left-sided ovarian cyst that ruptured.  There is no evidence of torsion or other concerning acute cause.  I counseled the patient on the results of the work-up.  She agrees to follow-up with her OB/GYN.  She is comfortable to go home.  Return  precautions given, and she expresses understanding. ____________________________________________   FINAL CLINICAL IMPRESSION(S) / ED DIAGNOSES  Final diagnoses:  Pelvic pain      NEW MEDICATIONS STARTED DURING THIS VISIT:  New Prescriptions   No medications on file     Note:  This document was prepared using Dragon voice recognition software and may include unintentional dictation errors.    Dionne BucySiadecki, Song Garris, MD 06/24/18 1122

## 2018-08-03 ENCOUNTER — Encounter: Payer: Self-pay | Admitting: Emergency Medicine

## 2018-08-03 ENCOUNTER — Emergency Department
Admission: EM | Admit: 2018-08-03 | Discharge: 2018-08-03 | Disposition: A | Discharge status: Home / Self Care | Payer: Medicaid Other | Attending: Emergency Medicine | Admitting: Emergency Medicine

## 2018-08-03 DIAGNOSIS — Z79899 Other long term (current) drug therapy: Secondary | ICD-10-CM | POA: Insufficient documentation

## 2018-08-03 DIAGNOSIS — R51 Headache: Secondary | ICD-10-CM | POA: Diagnosis present

## 2018-08-03 DIAGNOSIS — J020 Streptococcal pharyngitis: Secondary | ICD-10-CM | POA: Diagnosis not present

## 2018-08-03 MED ORDER — AMOXICILLIN 875 MG PO TABS: 875.0000 mg | ORAL_TABLET | Freq: Two times a day (BID) | ORAL | 0 refills | Status: AC

## 2018-08-03 MED ORDER — FLUCONAZOLE 150 MG PO TABS: 150.0000 mg | ORAL_TABLET | Freq: Every day | ORAL | 1 refills | Status: AC

## 2018-08-03 NOTE — ED Notes (Signed)
Body aches, sore throat and fever xfew days.

## 2018-08-03 NOTE — ED Provider Notes (Signed)
[facility] Emergency Department Provider Note  ____________________________________________  Time seen: Approximately 7:51 PM  I have reviewed the triage vital signs and the nursing notes.   HISTORY  Chief Complaint Generalized Body Aches    HPI Taylor Stephens is a 37 y.o. female presents to the emergency department with headache, pharyngitis, anterior neck discomfort, fever, chills and body aches for the past 2 days.  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  No associated congestion or cough.  No alleviating measures have been attempted.   History reviewed. No pertinent past medical history.  Patient Active Problem List   Diagnosis Date Noted  . Concussion 07/07/2017  . Acute pelvic pain, female 11/22/2015  . Foreign body in site in genitourinary tract 11/22/2015    Past Surgical History:  Procedure Laterality Date  . LAPAROSCOPY N/A 11/22/2015   Procedure: LAPAROSCOPY DIAGNOSTIC--removal of foreign body;  Surgeon: Nadara Mustard, MD;  Location: [facility] ORS;  Service: Gynecology;  Laterality: N/A;  . MOLE REMOVAL    . tubal ligastion      Prior to Admission medications   Medication Sig Start Date End Date Taking? Authorizing Provider  albuterol (PROVENTIL HFA;VENTOLIN HFA) 108 (90 Base) MCG/ACT inhaler Inhale 2 puffs into the lungs every 6 (six) hours as needed for wheezing or shortness of breath. 11/21/17   Enid Derry, PA-C  amoxicillin (AMOXIL) 875 MG tablet Take 1 tablet (875 mg total) by mouth 2 (two) times daily for 10 days. 08/03/18 08/13/18  Orvil Feil, PA-C  azithromycin (ZITHROMAX Z-PAK) 250 MG tablet Take 2 tablets (500 mg) on  Day 1,  followed by 1 tablet (250 mg) once daily on Days 2 through 5. 11/21/17   Enid Derry, PA-C  benzonatate (TESSALON PERLES) 100 MG capsule Take 1 capsule (100 mg total) by mouth 3 (three) times daily as needed for cough. 11/21/17 11/21/18  Enid Derry, PA-C  cetirizine  (ZYRTEC) 10 MG tablet Take 10 mg by mouth daily as needed for allergies.    [provider]  cyclobenzaprine (FLEXERIL) 10 MG tablet Take 1 tablet (10 mg total) by mouth at bedtime as needed for muscle spasms. Do not drive while taking as can cause drowsiness 07/02/17   Renford Dills, NP  hydrOXYzine (ATARAX/VISTARIL) 25 MG tablet Take 1 tablet (25 mg total) by mouth 2 (two) times daily as needed for anxiety. 07/02/17   Renford Dills, NP  metroNIDAZOLE (FLAGYL) 500 MG tablet Take 1 tablet (500 mg total) by mouth 3 (three) times daily. 09/15/17   Sharyn Creamer, MD  ondansetron (ZOFRAN) 4 MG tablet Take 1 tablet (4 mg total) by mouth daily as needed for nausea or vomiting. 11/21/17 11/21/18  Enid Derry, PA-C  oxyCODONE-acetaminophen (ROXICET) 5-325 MG tablet Take 1 tablet by mouth every 6 (six) hours as needed for severe pain. 09/15/17   Sharyn Creamer, MD  predniSONE (STERAPRED UNI-PAK 21 TAB) 10 MG (21) TBPK tablet Take 6 tablets on day 1. Take 5 tablets on day 2. Take 4 tablets on day 3. Take 3 tablets on day 4. Take 2 tablets on day 5. Take 1 tablets on day 6. 06/26/17   Little, Traci M, PA-C    Allergies Ibuprofen  No family history on file.  Social History Social History   Tobacco Use  . Smoking status: Never Smoker  . Smokeless tobacco: Never Used  Substance Use Topics  . Alcohol use: Yes    Comment: occ.  . Drug use: Not  on file     Review of Systems  Constitutional: Patient has chills. Eyes: No visual changes. No discharge ENT: Patient has pharyngitis.  Cardiovascular: no chest pain. Respiratory: no cough. No SOB. Gastrointestinal: No abdominal pain.  No nausea, no vomiting.  No diarrhea.  No constipation. Musculoskeletal: Negative for musculoskeletal pain. Skin: Negative for rash, abrasions, lacerations, ecchymosis. Neurological: Negative for headaches, focal weakness or numbness.   ____________________________________________   PHYSICAL EXAM:  VITAL  SIGNS: ED Triage Vitals [08/03/18 1839]  Enc Vitals Group     BP 117/80     Pulse Rate 98     Resp 18     Temp 99.6 F (37.6 C)     Temp Source Oral     SpO2 100 %     Weight 180 lb (81.6 kg)     Height 5\' 3"  (1.6 m)     Head Circumference      Peak Flow      Pain Score 7     Pain Loc      Pain Edu?      Excl. in GC?      Constitutional: Alert and oriented. Well appearing and in no acute distress. Eyes: Conjunctivae are normal. PERRL. EOMI. Head: Atraumatic. ENT:      Ears: TMs are pearly.      Nose: No congestion/rhinnorhea.      Mouth/Throat: Posterior pharynx is erythematous with bilateral tonsillar hypertrophy and exudate.  Uvula is midline. Neck: No stridor.  No cervical spine tenderness to palpation. Hematological/Lymphatic/Immunilogical: Palpable cervical lymphadenopathy. Cardiovascular: Normal rate, regular rhythm. Normal S1 and S2.  Good peripheral circulation. Respiratory: Normal respiratory effort without tachypnea or retractions. Lungs CTAB. Good air entry to the bases with no decreased or absent breath sounds. Musculoskeletal: Full range of motion to all extremities. No gross deformities appreciated. Neurologic:  Normal speech and language. No gross focal neurologic deficits are appreciated.  Skin:  Skin is warm, dry and intact. No rash noted. Psychiatric: Mood and affect are normal. Speech and behavior are normal. Patient exhibits appropriate insight and judgement.   ____________________________________________   LABS (all labs ordered are listed, but only abnormal results are displayed)  Labs Reviewed - No data to display ____________________________________________  EKG   ____________________________________________  RADIOLOGY   No results found.  ____________________________________________    PROCEDURES  Procedure(s) performed:    Procedures    Medications - No data to  display   ____________________________________________   INITIAL IMPRESSION / ASSESSMENT AND PLAN / ED COURSE  Pertinent labs & imaging results that were available during my care of the patient were reviewed by me and considered in my medical decision making (see chart for details).  Review of the [place] CSRS was performed in accordance of the [facility] prior to dispensing any controlled drugs.      Assessment and plan Strep pharyngitis Patient presents to the emergency department with headache, fever, chills, body aches and pharyngitis in the absence of cough.  History and physical exam findings are consistent with strep pharyngitis.  Patient was treated empirically with amoxicillin.  Vital signs are reassuring prior to discharge.  All patient questions were answered.    ____________________________________________  FINAL CLINICAL IMPRESSION(S) / ED DIAGNOSES  Final diagnoses:  Strep pharyngitis      NEW MEDICATIONS STARTED DURING THIS VISIT:  ED Discharge Orders         Ordered    amoxicillin (AMOXIL) 875 MG tablet  2 times daily     08/03/18  1949              This chart was dictated using voice recognition software/Dragon. Despite best efforts to proofread, errors can occur which can change the meaning. Any change was purely unintentional.    Orvil Feil, PA-C 08/03/18 Algis Liming, [place], MD 08/03/18 [phone]

## 2018-08-03 NOTE — Discharge Instructions (Signed)
Change tooth brush after completing amoxicillin.

## 2018-08-03 NOTE — ED Triage Notes (Signed)
PT arrives with complaints of generalized body aches, sinus congestions, and fever. Pt appears in NAD
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
## 2019-03-01 ENCOUNTER — Other Ambulatory Visit: Payer: Self-pay

## 2019-03-01 ENCOUNTER — Ambulatory Visit
Admission: EM | Admit: 2019-03-01 | Discharge: 2019-03-01 | Disposition: A | Discharge status: Home / Self Care | Payer: Medicaid Other | Attending: Family Medicine | Admitting: Family Medicine

## 2019-03-01 DIAGNOSIS — M62838 Other muscle spasm: Secondary | ICD-10-CM | POA: Diagnosis not present

## 2019-03-01 DIAGNOSIS — W541XXA Struck by dog, initial encounter: Secondary | ICD-10-CM | POA: Diagnosis not present

## 2019-03-01 DIAGNOSIS — M542 Cervicalgia: Secondary | ICD-10-CM

## 2019-03-01 MED ORDER — TIZANIDINE HCL 4 MG PO TABS: 4.0000 mg | ORAL_TABLET | Freq: Three times a day (TID) | ORAL | 0 refills | Status: AC | PRN

## 2019-03-01 NOTE — ED Triage Notes (Signed)
Patient states that last Wednesday her puppy tripped her up and she landed on her side while at home. Patient states that she has tried massaging and biofreeze. Patient states that she was feeling some better but symptoms worsened on Sunday and have remained constant since.

## 2019-03-01 NOTE — Discharge Instructions (Signed)
Rest. Heat.  Hot shower/bath.  Tylenol as needed.  Muscle relaxer as prescribed.  Take care  Dr. Adriana Simas

## 2019-03-01 NOTE — ED Provider Notes (Signed)
[facility]    CSN: [id number] Arrival date & time: 03/01/19  0827     History   Chief Complaint Chief Complaint  Patient presents with  . Neck Pain    HPI   38 year old female presents with neck pain.  Patient states that she suffered a fall last Wednesday.  She states that she was tripped by her puppy.  She subsequently fell and injured her neck.  Patient states that she has been applying Biofreeze and massaging her neck with improvement.  However, on Sunday she seemed to worsen.  No reports of numbness or paresthesias.  Her neck pain is located bilaterally, predominantly in the trapezius region.  Worse with range of motion.  She has used massage and Biofreeze with improvement but no resolution.  Pain is currently 8/10 in severity.  No other associated symptoms. No other complaints.  History reviewed and updated as below.  PMH: Patient Active Problem List   Diagnosis Date Noted  . Concussion 07/07/2017  . Acute pelvic pain, female 11/22/2015  . Foreign body in site in genitourinary tract 11/22/2015    Past Surgical History:  Procedure Laterality Date  . LAPAROSCOPY N/A 11/22/2015   Procedure: LAPAROSCOPY DIAGNOSTIC--removal of foreign body;  Surgeon: Nadara Mustardobert P Harris, MD;  Location: [facility] ORS;  Service: Gynecology;  Laterality: N/A;  . MOLE REMOVAL    . tubal ligastion      OB History    Gravida  3   Para  2   Term      Preterm      AB  1   Living        SAB      TAB      Ectopic      Multiple      Live Births               Home Medications    Prior to Admission medications   Medication Sig Start Date End Date Taking? Authorizing Provider  cetirizine (ZYRTEC) 10 MG tablet Take 10 mg by mouth daily as needed for allergies.   Yes [provider]  tiZANidine (ZANAFLEX) 4 MG tablet Take 1 tablet (4 mg total) by mouth every 8 (eight) hours as needed for muscle spasms. 03/01/19   Tommie Samsook, Blia Totman G, DO   Social History Social  History   Tobacco Use  . Smoking status: Never Smoker  . Smokeless tobacco: Never Used  Substance Use Topics  . Alcohol use: Yes    Comment: occasionally  . Drug use: Never     Allergies   Ibuprofen   Review of Systems Review of Systems  Constitutional: Negative.   Musculoskeletal: Positive for neck pain and neck stiffness.   Physical Exam Triage Vital Signs ED Triage Vitals  Enc Vitals Group     BP 03/01/19 0843 (!) 119/93     Pulse Rate 03/01/19 0843 66     Resp 03/01/19 0843 16     Temp 03/01/19 0843 98.5 F (36.9 C)     Temp Source 03/01/19 0843 Oral     SpO2 03/01/19 0843 99 %     Weight 03/01/19 0839 185 lb (83.9 kg)     Height 03/01/19 0839 5\' 3"  (1.6 m)     Head Circumference --      Peak Flow --      Pain Score 03/01/19 0839 8     Pain Loc --      Pain Edu? --  Excl. in GC? --    Updated Vital Signs BP (!) 119/93 (BP Location: Left Arm)   Pulse 66   Temp 98.5 F (36.9 C) (Oral)   Resp 16   Ht 5\' 3"  (1.6 m)   Wt 83.9 kg   LMP 02/26/2019   SpO2 99%   BMI 32.77 kg/m   Visual Acuity Right Eye Distance:   Left Eye Distance:   Bilateral Distance:    Right Eye Near:   Left Eye Near:    Bilateral Near:     Physical Exam Vitals signs and nursing note reviewed.  Constitutional:      General: She is not in acute distress.    Appearance: Normal appearance.  HENT:     Head: Normocephalic and atraumatic.  Eyes:     General:        Right eye: No discharge.        Left eye: No discharge.     Conjunctiva/sclera: Conjunctivae normal.  Neck:     Comments: Patient with bilateral trapezius muscle spasm.  Decreased range of motion. Cardiovascular:     Rate and Rhythm: Normal rate and regular rhythm.  Pulmonary:     Effort: Pulmonary effort is normal.     Breath sounds: Normal breath sounds.  Neurological:     Mental Status: She is alert.  Psychiatric:        Mood and Affect: Mood normal.        Behavior: Behavior normal.    UC Treatments  / Results  Labs (all labs ordered are listed, but only abnormal results are displayed) Labs Reviewed - No data to display  EKG None  Radiology No results found.  Procedures Procedures (including critical care time)  Medications Ordered in UC Medications - No data to display  Initial Impression / Assessment and Plan / UC Course  I have reviewed the triage vital signs and the nursing notes.  Pertinent labs & imaging results that were available during my care of the patient were reviewed by me and considered in my medical decision making (see chart for details).    38 year old female presents with neck pain and trapezius muscle spasm.  Advised Tylenol.  Cannot take NSAIDs due to allergy.  Treating with Zanaflex as well.   Final Clinical Impressions(s) / UC Diagnoses   Final diagnoses:  Neck pain  Trapezius muscle spasm     Discharge Instructions     Rest. Heat.  Hot shower/bath.  Tylenol as needed.  Muscle relaxer as prescribed.  Take care  Dr. Adriana Simas    ED Prescriptions    Medication Sig Dispense Auth. Provider   tiZANidine (ZANAFLEX) 4 MG tablet Take 1 tablet (4 mg total) by mouth every 8 (eight) hours as needed for muscle spasms. 30 tablet Tommie Sams, DO     Controlled Substance Prescriptions [place] Controlled Substance Registry consulted? Not Applicable   Tommie Sams, [place] 03/01/19 7681

## 2022-12-08 ENCOUNTER — Other Ambulatory Visit: Payer: Self-pay

## 2022-12-08 ENCOUNTER — Emergency Department
Admission: EM | Admit: 2022-12-08 | Discharge: 2022-12-08 | Disposition: A | Discharge status: Home / Self Care | Payer: BC Managed Care – PPO | Attending: Student in an Organized Health Care Education/Training Program | Admitting: Student in an Organized Health Care Education/Training Program

## 2022-12-08 ENCOUNTER — Encounter: Payer: Self-pay | Admitting: Emergency Medicine

## 2022-12-08 DIAGNOSIS — Z20822 Contact with and (suspected) exposure to covid-19: Secondary | ICD-10-CM | POA: Diagnosis not present

## 2022-12-08 DIAGNOSIS — J111 Influenza due to unidentified influenza virus with other respiratory manifestations: Secondary | ICD-10-CM

## 2022-12-08 DIAGNOSIS — J101 Influenza due to other identified influenza virus with other respiratory manifestations: Secondary | ICD-10-CM | POA: Diagnosis not present

## 2022-12-08 DIAGNOSIS — R059 Cough, unspecified: Secondary | ICD-10-CM | POA: Diagnosis present

## 2022-12-08 LAB — RESP PANEL BY RT-PCR (RSV, FLU A&B, COVID)  RVPGX2
Influenza A by PCR: NEGATIVE
Influenza B by PCR: POSITIVE — AB
Resp Syncytial Virus by PCR: NEGATIVE
SARS Coronavirus 2 by RT PCR: NEGATIVE

## 2022-12-08 MED ORDER — ALBUTEROL SULFATE HFA 108 (90 BASE) MCG/ACT IN AERS: 2.0000 | INHALATION_SPRAY | Freq: Four times a day (QID) | RESPIRATORY_TRACT | 2 refills | Status: AC | PRN

## 2022-12-08 MED ORDER — PREDNISONE 10 MG PO TABS: ORAL_TABLET | ORAL | 0 refills | Status: AC

## 2022-12-08 MED ORDER — GUAIFENESIN ER 600 MG PO TB12: 600.0000 mg | ORAL_TABLET | Freq: Two times a day (BID) | ORAL | 0 refills | Status: AC

## 2022-12-08 MED ORDER — PSEUDOEPHEDRINE HCL 30 MG PO TABS: 30.0000 mg | ORAL_TABLET | Freq: Four times a day (QID) | ORAL | 2 refills | Status: AC | PRN

## 2022-12-08 NOTE — Discharge Instructions (Addendum)
-  You tested positive for influenza today.  Your symptoms should improve on their own within the next 5 to 7 days.  In the meantime, you may continue taking Tylenol/ibuprofen as needed for the fever and bodyaches.  You may additionally take guaifenesin and pseudoephedrine for the sinus congestion.  -Given your history of bronchitis, please take the full course of the prednisone as prescribed to decrease inflammation in your lungs.  You may additionally utilize the albuterol inhaler as needed for chest tightness/shortness of breath.  -Return to the emergency department anytime if you begin to experience any new or worsening symptoms.

## 2022-12-08 NOTE — ED Triage Notes (Signed)
Pt sts that she has been having coughing with lack of taste and congestion.

## 2022-12-08 NOTE — ED Provider Notes (Signed)
[facility] Provider Note    Event Date/Time   First MD Initiated Contact with Patient 12/08/22 1827     (approximate)   History   Chief Complaint Cough   HPI Taylor Stephens is a 42 y.o. female, no significant medical history, presents to the emergency department for evaluation of flulike symptoms.  She reports cough, congestion, and loss of taste.  She states that her symptoms started approximately 48 hours ago.  Denies fever/chills, chest pain, shortness of breath, abdominal pain, flank pain, nausea/vomiting, diarrhea, urinary symptoms, rash/lesions, or dizziness/lightheadedness.  History Limitations: No limitations.        Physical Exam  Triage Vital Signs: ED Triage Vitals  Enc Vitals Group     BP 12/08/22 1754 (!) 125/92     Pulse Rate 12/08/22 1754 (!) 103     Resp 12/08/22 1754 17     Temp 12/08/22 1754 98.8 F (37.1 C)     Temp Source 12/08/22 1754 Oral     SpO2 12/08/22 1754 99 %     Weight 12/08/22 1755 185 lb (83.9 kg)     Height --      Head Circumference --      Peak Flow --      Pain Score 12/08/22 1755 4     Pain Loc --      Pain Edu? --      Excl. in [place]? --     Most recent vital signs: Vitals:   12/08/22 1754  BP: (!) 125/92  Pulse: (!) 103  Resp: 17  Temp: 98.8 F (37.1 C)  SpO2: 99%    General: Awake, appears uncomfortable.  Audible cough and sinus congestion. Skin: Warm, dry. No rashes or lesions.  Eyes: PERRL. Conjunctivae normal.  CV: Good peripheral perfusion.  Resp: Normal effort.  Lung sounds clear bilaterally. Abd: Soft, non-tender. No distention.  Neuro: At baseline. No gross neurological deficits.  Musculoskeletal: Normal ROM of all extremities.  Physical Exam    ED Results / Procedures / Treatments  Labs (all labs ordered are listed, but only abnormal results are displayed) Labs Reviewed  RESP PANEL BY RT-PCR (RSV, FLU A&B, COVID)  [id number] - Abnormal; Notable for the following components:       Result Value   Influenza B by PCR POSITIVE (*)    All other components within normal limits     EKG N/A.    RADIOLOGY  ED Provider Interpretation: N/A.  No results found.  PROCEDURES:  Critical Care performed: N/A.  Procedures    MEDICATIONS ORDERED IN ED: Medications - No data to display   IMPRESSION / MDM / Young Harris / ED COURSE  I reviewed the triage vital signs and the nursing notes.                              Differential diagnosis includes, but is not limited to, COVID-19, influenza, RSV, bronchitis, community-acquired pneumonia, viral URI, allergic rhinitis.  Assessment/Plan Presentation consistent with influenza, confirmed by PCR.  She appears well clinically.  Vitals are within normal limits.  Lung sounds are clear bilaterally.  No indication for chest x-ray imaging at this time.  Discussed the risks and benefits of antiviral treatments.  Patient has opted to forego Tamiflu at this time.  However, we will provide her with prescription for prednisone, albuterol, pseudoephedrine, guaifenesin to help manage her symptoms.  Encouraged her to follow-up with her  primary care provider as needed.  Provided with a work note as well.  Will discharge.  Provided the patient with anticipatory guidance, return precautions, and educational material. Encouraged the patient to return to the emergency department at any time if they begin to experience any new or worsening symptoms. Patient expressed understanding and agreed with the plan.   Patient's presentation is most consistent with acute complicated illness / injury requiring diagnostic workup.       FINAL CLINICAL IMPRESSION(S) / ED DIAGNOSES   Final diagnoses:  Influenza     Rx / DC Orders   ED Discharge Orders          Ordered    predniSONE (DELTASONE) 10 MG tablet  Q breakfast        12/08/22 1848    albuterol (VENTOLIN HFA) 108 (90 Base) MCG/ACT inhaler  Every 6 hours PRN        12/08/22  1848    pseudoephedrine (SUDAFED) 30 MG tablet  Every 6 hours PRN        12/08/22 1848    guaiFENesin (MUCINEX) 600 MG 12 hr tablet  2 times daily        12/08/22 1848             Note:  This document was prepared using Dragon voice recognition software and may include unintentional dictation errors.   Teodoro Spray, [place] 12/08/22 1854    Merlyn Lot, MD 12/08/22 2158

## 2025-02-06 ENCOUNTER — Ambulatory Visit: Admitting: Family Medicine
